# Patient Record
Sex: Female | Born: 1975 | Race: Black or African American | Hispanic: No | Marital: Married | Smoking: Never smoker
Health system: Southern US, Community
[De-identification: ages and names within clinical notes are randomized; demographics above are authoritative.]

## PROBLEM LIST (undated history)

## (undated) DIAGNOSIS — E119 Type 2 diabetes mellitus without complications: Secondary | ICD-10-CM

## (undated) DIAGNOSIS — M6282 Rhabdomyolysis: Secondary | ICD-10-CM

---

## 2001-05-17 ENCOUNTER — Other Ambulatory Visit: Admission: RE | Admit: 2001-05-17 | Discharge: 2001-05-17 | Payer: Self-pay | Admitting: Obstetrics and Gynecology

## 2001-05-17 ENCOUNTER — Other Ambulatory Visit: Admission: RE | Admit: 2001-05-17 | Discharge: 2001-05-17 | Payer: Self-pay | Admitting: *Deleted

## 2001-11-25 ENCOUNTER — Inpatient Hospital Stay (HOSPITAL_COMMUNITY): Admission: AD | Admit: 2001-11-25 | Discharge: 2001-11-28 | Payer: Self-pay | Admitting: Obstetrics and Gynecology

## 2002-04-26 ENCOUNTER — Other Ambulatory Visit: Admission: RE | Admit: 2002-04-26 | Discharge: 2002-04-26 | Payer: Self-pay | Admitting: Obstetrics and Gynecology

## 2004-02-04 ENCOUNTER — Other Ambulatory Visit: Admission: RE | Admit: 2004-02-04 | Discharge: 2004-02-04 | Payer: Self-pay | Admitting: Obstetrics and Gynecology

## 2004-07-31 ENCOUNTER — Inpatient Hospital Stay (HOSPITAL_COMMUNITY): Admission: EM | Admit: 2004-07-31 | Discharge: 2004-08-05 | Payer: Self-pay | Admitting: *Deleted

## 2005-05-17 ENCOUNTER — Other Ambulatory Visit: Admission: RE | Admit: 2005-05-17 | Discharge: 2005-05-17 | Payer: Self-pay | Admitting: Obstetrics and Gynecology

## 2005-11-24 ENCOUNTER — Encounter: Admission: RE | Admit: 2005-11-24 | Discharge: 2005-11-24 | Payer: Self-pay | Admitting: Obstetrics and Gynecology

## 2006-01-04 ENCOUNTER — Inpatient Hospital Stay (HOSPITAL_COMMUNITY): Admission: AD | Admit: 2006-01-04 | Discharge: 2006-01-07 | Payer: Self-pay | Admitting: Obstetrics and Gynecology

## 2006-01-04 ENCOUNTER — Encounter (INDEPENDENT_AMBULATORY_CARE_PROVIDER_SITE_OTHER): Payer: Self-pay | Admitting: Specialist

## 2009-11-19 ENCOUNTER — Other Ambulatory Visit: Admission: RE | Admit: 2009-11-19 | Discharge: 2009-11-19 | Payer: Self-pay | Admitting: Family Medicine

## 2010-03-30 ENCOUNTER — Other Ambulatory Visit: Payer: Self-pay | Admitting: Family Medicine

## 2010-03-30 ENCOUNTER — Ambulatory Visit
Admission: RE | Admit: 2010-03-30 | Discharge: 2010-03-30 | Disposition: A | Discharge status: Home / Self Care | Payer: 59 | Source: Ambulatory Visit | Attending: Family Medicine | Admitting: Family Medicine

## 2010-03-30 DIAGNOSIS — R1011 Right upper quadrant pain: Secondary | ICD-10-CM

## 2010-03-30 MED ORDER — IOHEXOL 300 MG/ML  SOLN
100.0000 mL | Freq: Once | INTRAMUSCULAR | Status: AC | PRN
  Administered 2010-03-30: 100 mL via INTRAVENOUS

## 2010-04-05 ENCOUNTER — Other Ambulatory Visit: Payer: Self-pay

## 2010-04-09 ENCOUNTER — Other Ambulatory Visit: Payer: Self-pay | Admitting: General Surgery

## 2010-04-15 ENCOUNTER — Ambulatory Visit
Admission: RE | Admit: 2010-04-15 | Discharge: 2010-04-15 | Disposition: A | Discharge status: Home / Self Care | Payer: 59 | Source: Ambulatory Visit | Attending: General Surgery | Admitting: General Surgery

## 2010-07-09 NOTE — H&P (Signed)
NAME:  Tracy, Hoover                        ACCOUNT NO.:  [id number]   MEDICAL RECORD NO.:  [id number]                   PATIENT TYPE:  MAT   LOCATION:  MATC                                 FACILITY:  [facility]   PHYSICIAN:  Crist Fat. Rivard, M.D.              DATE OF BIRTH:  28-Jan-1976   DATE OF ADMISSION:  11/25/2001  DATE OF DISCHARGE:                                HISTORY & PHYSICAL   HISTORY OF PRESENT ILLNESS:  The patient is a 35 year old gravida 1, para 0,  at 37-3/7 weeks who presents with spontaneous rupture of membranes at  approximately 5:30 p.m. this afternoon with clear fluid noted and minimal  uterine contractions.  She reports positive fetal movement and denies  vaginal bleeding, headache, blurry vision, or epigastric pain.  Pregnancy  has been remarkable for:  1:  Positive group B Strep; 2.  History of  seizures in the past, but no seizures in 6-7 years and no current  medications; 3.  History of depression.   PRENATAL LABORATORY:  Blood type is O positive, Rh antibody negative, VDRL  nonreactive, rubella titer positive, hepatitis B surface antigen negative,  HIV nonreactive, sickle cell test negative.  GC and Chlamydia cultures were  normal.  Pap was normal.  Glucose challenge was normal.  AFP was normal.  Hemoglobin upon entering the practice was 11.2.  It was 9.9 at 25-6/7 weeks.  EDC of December 13, 2001 was established by last menstrual period and was in  agreement with ultrasound at approximately 18 weeks.  Group B Strep culture  was positive at 36 weeks.   HISTORY OF PRESENT PREGNANCY:  The patient entered care at approximately 10  weeks.  She had a yeast infection noted on her Pap smear and treated that  when she began to have some symptoms.  She had an ultrasound done at 17  weeks that showed normal growth and fluid in anatomy.  Four chamber heart  was seen on ultrasound at 26 weeks.  She had minimal weight gain during this  pregnancy, but had adequate  fundal height growth.  She did have carpal  tunnel during this pregnancy and was using wrist splints.  The rest of her  pregnancy was essentially uncomplicated.   OBSTETRICAL HISTORY:  The patient is primigravida.   PAST MEDICAL HISTORY:  1. The patient was on Nordette until December 2001.  2. In 1996 she had Chlamydia and was treated.  3. She has a history of occasional yeast.  4. She reports the usual childhood illnesses.  5. She has a history of anemia.  6. 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  She has not been on any medications and not had     any seizures for that period of time.  7. In 1997 she was diagnosed with depression and took Prozac  for a short     period of time.   ALLERGIES:  She has no known medications allergies.   FAMILY HISTORY:  Several family members have hypertension.  Her mother and  father both have hypertension.  Her mother has anemia, maternal grandfather  and paternal grandfather have emphysema.  Her mother, brother, father, and  maternal grandmother, and aunts and uncles all have diabetes.  Her maternal  grandmother had dialysis secondary to diabetes.  Both sets of grandparents  and her mother have some type of arthritis.  Great uncle and second cousins  have some type of cancer.   Mother had seizures from Alzheimer's.   GENETIC HISTORY:  Remarkable for the father of the baby having possible  sickle cell trait.   SOCIAL HISTORY:  The patient is single.  The father of the baby is involved  and supportive.  His name is Chantil Bari.  The patient is [place] and of the [place] faith.  She has four years of college.  She is  employed with early [employer].  Her partner is high school educated.  He is  employed at [employer].  She has been followed by the certified nurse  midwife service of [facility].  She denies any alcohol, drug, or tobacco  use during this pregnancy.    PHYSICAL EXAMINATION:  VITAL SIGNS:  Vital signs are stable.  The patient is  afebrile.  HEENT:  Within normal limits.  LUNGS:  Breath sounds are clear.  HEART:  Regular rate and rhythm without murmur.  BREASTS:  Soft and nontender.  ABDOMEN:  Fundal height is approximately 37 cm.  Estimated fetal weight 6-7  pounds.  Uterine contractions are every six minutes, very mild.  The patient  is unaware of them.  The patient is leaking copious, clear fluid.  Electronic fetal monitor reveals reactive fetal heart rate tracing with no  decelerations.  PELVIC:  Cervix stretch fingertip, posterior thick, and presenting part was  out of the pelvis.  Vertex was verified by bedside ultrasound.  EXTREMITIES:  Deep tendon reflexes are 2+ without clonus.  There is a trace  of edema noted.   IMPRESSION:  1. Intrauterine pregnancy at 37-3/7 weeks.  2. Spontaneous rupture of membranes, minimal labor.  3. Positive group B Strep.   PLAN:  1. Admit to birthing suite for consult with Dr. Silverio Lay as attending     physician.  2. Group B Strep prophylaxis of penicillin G per standard dosing.  3. Will augment labor p.r.n. if no increase in labor in 4-6 hours.        Renaldo Reel Emilee Hero, C.N.M.                   Crist Fat Rivard, M.D.    Leeanne Mannan  D:  11/25/2001  T:  11/25/2001  Job:  [id number]

## 2010-07-09 NOTE — H&P (Signed)
[name], [name]              ACCOUNT NO.:  [id number]   MEDICAL RECORD NO.:  [id number]          PATIENT TYPE:  INP   LOCATION:  0101                         FACILITY:  [facility]   PHYSICIAN:  Melissa L. Ladona Ridgel, MD  DATE OF BIRTH:  1975-04-24   DATE OF ADMISSION:  07/31/2004  DATE OF DISCHARGE:                                HISTORY & PHYSICAL   CHIEF COMPLAINT:  Pain all over my body and blood in my urine.   PRIMARY CARE PHYSICIAN:  She has none; she usually sees the Urgent Care in  [place].   HISTORY OF PRESENT ILLNESS:  The patient is a 35 year old African-American  female who developed aching of her body and thought that she had the flu  approximately one week ago, the Thursday before this admission.  She states  that she took Tylenol Flu and stated that the symptoms improved.  The Monday  after the onset, she states that the symptoms returned and she proceeded to  notice that her urine was quite dark and in fact, she thought it was bloody,  and therefore she went to the [place] [facility].  The patient went to  the Urgent Care and received antibiotics for what was felt to be a urinary  tract infection.  She started those and when her body aches continued to  escalate including increase in leg pain especially in her calves, she went  back to the Urgent Care.  They did some laboratory work and noted that her  potassium was high and sent her to the emergency room.   REVIEW OF SYSTEMS:  She denies any fevers, chills, nausea, vomiting or  diarrhea.  She has not been eating very well or drinking because she states  that is painful to get out of bed.  She denies any headaches and no visual  changes, no shortness of breath or chest pain.  She does have diffuse muscle  pain, specifically in her calves.  All other review of systems were  negative.   PAST MEDICAL HISTORY:  She had a history of grand mal seizures that were  supposedly stress induced.  She was treated with  medications but then that  was discontinued.  Her last seizure was in 1998.  Her last menstrual period  was Jul 10, 2004.  She recently visited the Urgent Care center for headaches  which were felt to be stress related.   PAST SURGICAL HISTORY:  She denies any.   SOCIAL HISTORY:  She does not smoke.  She has occasional alcohol.  She works  as a Child psychotherapist.  She denies any illicit drugs.   FAMILY HISTORY:  Mom and dad are both living with diabetes.  She has a  brother who has diabetes, who was diagnosed in his early 79s, and mother  mentioned that he may have had a similar event but does not know when.   ALLERGIES:  No known drug allergies.   MEDICATIONS:  None.   VITAL SIGNS: Temperature 97, blood pressure 120/71, pulse 128 and regular,  respiratory 20, saturation 98%.  Generally, she is in no acute  distress.  She is normocephalic, atraumatic.  Pupils equal, round, and reactive to  light.  Extraocular movements are intact.  Mucous membranes are moist.  NECK:  Supple.  There is no JVD, no lymph nodes, and no carotid bruits.  CHEST:  Clear to auscultation with decreased breath sounds, right greater  than left base.  No rhonchi, rales or wheezes.  CARDIOVASCULAR:  Tachycardic.  Positive S1 and S2.  No S3 or S4.  No  murmurs, rubs or gallops.  ABDOMEN:  Soft, nontender, nondistended with positive bowel sounds.  EXTREMITIES:  No clubbing, cyanosis or edema.  She does have pain all over  in all muscle groups but in particular, she is complaining of bilateral calf  pain.  NEUROLOGIC:  She is awake, alert and oriented 3.  Power is 4/5, symmetrical  but limited by pain.  DTRs are 2+.  She has down going plantars.   Her laboratories reveal sodium 135, potassium 5.4, chloride 100, CO2 25, BUN  14, creatinine 0.9, glucose 92.  White count 9.0, hemoglobin 14.7,  hematocrit 43.3, platelets 348.  Urine pregnancy is negative.  Her U/A shows  a pH of 5.5, greater than 80 ketones, leukocyte  esterase small, nitrates  negative, WBC count 0.2.  AST 1484, ALT 443. CK 381,025.  EKG is pending but  her rhythm strip shows normal sinus rhythm at the rate of 128-130, clear P  waves, no ST/T wave changes.   ASSESSMENT/PLAN:  This is a 35 year old African-American female with a  recent complaint of upper respiratory infection-type symptoms, mild sore  throat, muscle aches that progressed, worsening fatigue, muscle pain.  She  was found to have increased potassium and tachycardia with severe  rhabdomyolysis today in the emergency room.  The patient had interim  treatment for a urinary tract infection with Levaquin but then progressed on  to the current symptomatology.  At this time the differential diagnosis  includes virally induced rhabdomyolysis.   Problem 1. Cardiovascular:  She is tachycardic likely secondary to  dehydration and pain.  Myocarditis is less likely since she does not  complain of any chest pain and has no rub.  We will aggressively rehydrate,  check a baseline EKG, serial cardiac enzymes in order to evaluate and treat  what appears to be rhabdomyolysis.   Problem 2. Pulmonary:  She has no clinical history for any pneumonia and no  clinical findings consistent with that other than decreased breath sounds at  the right base.  We will check a chest x-ray to rule out occult pneumonia.   Problem 3. GI, Anorexia:  Will encourage a soft diet and start with clears  and advance as tolerated.   Problem 4.  GU: Right now, she is receiving appropriately a bicarb drip  which will aide in the alkalinization of her urine.  We will follow up a  potassium level.  Will consider adding Lasix if the patient does not diurese  well.  A Foley catheter will be added to allow for her strict Is and Os.   Problem 5. Endocrine:  She has no history of diabetes, but has a brother  diagnosed in his early 2s.  We will follow her blood sugars while she is on the D5, following her potassium  levels, and treat appropriately if they  become more elevated and will check a TSH to rule out hypothyroidism.   Problem 6. ID:  This likely represents a community acquired viral illness.  The patient has no recent travel  and at this time does not appear to have  risk factors for HIV although primary HIV presentation could present this  way.  Will address further inquiries if appropriate regarding her HIV  status.  DVT prophylaxis will be with Lovenox and will check ultrasounds of  the lower extremities to rule out DVT since she has been lying around for at  least a week.      MLT/MEDQ  D:  07/31/2004  T:  07/31/2004  Job:  [id number]

## 2010-07-09 NOTE — Discharge Summary (Signed)
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 Approximately July 24, 2004 she began to  experience generalized aching and flu-like symptoms. She took Tylenol Flu  with some improvement in her symptoms but on June 5 the aching became more  severe. She also noticed that her urine had a dark appearance. She went to  [facility] and a urinalysis was done. She was advised that  she probably had a urinary tract infection and was given an antibiotic. She  did not recall exactly which antibiotic she received. The muscle aches  became increasingly severe, particularly in the calf area. She returned to  [facility] and laboratory studies were obtained. Concern was  expressed about her potassium level and she was referred to [facility]  emergency room. In the emergency room she was seen by Dr. Derenda Mis.   PHYSICAL EXAMINATION:  VITAL SIGNS: At the time of her evaluation showed a  temperature 97, blood pressure 120/71, pulse was 120, respirations 20, O2  saturation 98%.  HEENT EXAM: Within normal limits.  CHEST:  Clear.  CARDIOVASCULAR:  Revealed a regular rate and rhythm. There was normal S1-S2,  the patient was tachycardiac. There are no rubs, murmurs or gallops.  ABDOMEN:  The abdomen was benign.  NEUROLOGIC TESTING:  Was within normal limits.  EXTREMITIES: Examination of extremities revealed no clubbing, cyanosis or  edema. The patient complained subjectively of diffuse muscle pain but  muscles were not especially tender to palpation.   HOSPITAL COURSE:   Relevant initial laboratory studies obtained  which  included a CBC which showed a white count of 9000, hemoglobin of 14.7. CMP  which was notable for a potassium of 5.4, creatinine of 0.9 and abnormal  SGOT of 1484. A CPK was obtained which was 381,025 Urine. pregnancy test was  negative. Urinalysis was interpreted as showing large amounts of blood and  protein. A TSH was within normal limits. A chest x-ray was normal.  On  admission the patient was placed on an IV of D5 with 2 ampules of  bicarbonate which the patient received for total of 1 liter. She was then  given D5 and half-normal saline with 2 ampules of bicarbonate 200 cc/hour. A  Foley catheter was placed. Morphine was administered for pain. The patient  was placed on subcutaneous Lovenox. Venous Doppler's of the lower  extremities were performed which were normal.  CPK was carefully monitored  and came down in progressive increments. On June 11th it was 191,000, on  June 12  it was 146,000, on June 13 it was 94,000, on June 14 it was 52,000,  and on June15 it  was  30,000.   LABORATORY DATA:  Blood pressure remained stable; renal function was also  carefully monitored and  remained normal. For example, on June15 the  creatinine was 0.7. On June 13 the Foley catheter was discontinued. On  June14 the bicarbonate was removed from the IV and the patient was just  given IV of normal saline. By EAVW09 her muscle aching had entirely  resolved. The patient was feeling well. She had a good appetite. She was  afebrile. She was having no nausea or vomiting. She was taking p.o. and  fluids without difficulty. Urine was normal in appearance. It was therefore  felt reasonable to discharge the patient at that time.   DISCHARGE DIAGNOSIS:  Rhabdomyolysis probably secondary to viral illness.   DISCHARGE MEDICATIONS:  None.   DISCHARGE INSTRUCTIONS:  The patient was advised at the time of discharge to  drink lots of fluids to maintain a clear  urine. She was also advised to  follow-up with a physician on the following Monday to repeat her CPK and  BMET values. Previously she has been going to [facility] but  she expressed a desire to be seen at one of the [place] locations so I  suggested that she follow-up with the [place] physicians at [facility].  I  provided her with their telephone number and suggested that she see Dr.  Lynelle Doctor or Dr. Delrae Alfred. The patient was advised that if her severe muscle  cramps should return that we would be happy to see her in the emergency room  at any time although I think this is quite unlikely.   Condition at time of discharge was good.       SY/MEDQ  D:  08/05/2004  T:  08/05/2004  Job:  [id number]   cc:   [name]   [facility]  [place]

## 2010-07-09 NOTE — H&P (Signed)
NAMESALEEN, PEDEN             ACCOUNT NO.:  [id number]   MEDICAL RECORD NO.:  [id number]          PATIENT TYPE:  INP   LOCATION:  9168                          FACILITY:  [facility]   PHYSICIAN:  Naima A. Dillard, M.D. DATE OF BIRTH:  Mar 23, 1975   DATE OF ADMISSION:  01/04/2006  DATE OF DISCHARGE:                                HISTORY & PHYSICAL   Ms. Mott is a 35 year old gravida 2, para 1-0-1-1, at 31 weeks who  presented with spontaneous rupture of membranes at approximately 7:30 p.m.  with clear fluid noted.  Contractions currently are every 2-3 minutes.  The  pregnancy has been remarkable for:  1. Gestational diabetes with no insulin required.  2. Questionable dates.  3. Seizure disorder with patient off medications since 1993.  4. History of rhabdomyolysis.  5. History of depression.  6. Increased Down's syndrome risk on quadruple screening but amniocentesis      declined.   Prenatal labs reveal blood type O positive, Rh antibody negative, VDRL  nonreactive, Rubella titer positive, hepatitis B surface antigen negative,  sickle cell test negative.  cystic fibrosis testing was negative, GC and  Chlamydia cultures were negative.  Pap was normal in March.  Hemoglobin upon  entering the practice was 11.2.  It was 9.7 at 28 weeks.  Quadruple  screening showed an elevated Down's syndrome risk of 1 in 269.  Amniocentesis was declined.  She had an elevated one hour Glucola at 197.  Three hour GTT was also abnormal with a diagnosis of gestational diabetes.  Group B Strep culture was negative at 36 weeks.  EDC of January 17, 2006,  was established by seven week ultrasound secondary to questionable LMP.   HISTORY OF PRESENT PREGNANCY:  The patient entered care at approximately 10  weeks.  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  She had a  quadruple screen done at 8 weeks that showed an elevated Down's syndrome  risk of 1 in 269.  Amniocentesis was offered but she  declined this.  She had  an ultrasound for anatomy at 18 weeks with no significant findings.  HIV was  also done at 18 weeks which was negative.  She had another ultrasound at 25  weeks showing normal growth and development, growth in the 73rd percentile,  fluid was normal.  She had another ultrasound at 28 weeks, again, with  normal findings.  Growth was at the 89th percentile and fluid was at the  55th percentile.  The patient's hemoglobin at 28 weeks was 9.7.  She was  placed on iron.  She had an elevated one hour GGT of 197 and had a three  hour GGT that had all abnormal values except for her fasting.  She had  another ultrasound at 32 weeks showing growth at the 95th percentile.  She  had twice weekly NSTs begun at 32 weeks.  She had another ultrasound at 36  weeks with normal growth.  She was scheduled for induction on November 26.   OBSTETRICAL HISTORY:  In 2003, she had a vaginal birth of a female infant,  weight 6  pounds 13 ounces at 38 weeks.  She was in labor 12 hours.  She had  epidural anesthesia.  She did have some depression, another episode of  depression in the past, this did resolve.   MEDICAL HISTORY:  She was on oral contraceptives until February 2007.  She  reports the usual childhood illnesses.  She has a history of anemia as a  teenager.  She did have grand mal seizures in 1988.  She was on medications  at that time but those were discontinued in 1993.  She was diagnosed with  rhabdomyolysis in 2006.  She was hospitalized 4-5 days for this.  She was  treated for depression in 1997 but has not been on any medication since  then.   ALLERGIES:  The patient has no known medication allergies.   FAMILY HISTORY:  Her father has heart disease and had a bypass surgery.  Her  maternal uncle had an MI and is now deceased.  Her mother, father, and  maternal grandmother have hypertension.  A maternal aunt had an aneurysm.  The patient's mother had anemia.  Paternal  grandfather and maternal  grandfather had emphysema.  Her mother, father, brother, maternal  grandmother, and maternal uncles have insulin dependent diabetes.  Paternal  grandmother had Alzheimer's.  A paternal aunt had some unknown type of  cancer.   The patient has only had hospitalization for childbirth and for the  rhabdomyolysis diagnosis in 2006.   GENETIC HISTORY:  Remarkable for the patient's nephew being mentally  retarded.   SOCIAL HISTORY:  The patient is married to the father of the baby.  He is  involved and supportive, his name is Lynnell Dike.  The patient is  Tree surgeon.  She is of the [place] faith.  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  Her husband has one year of college and is employed in  production.  She was originally followed by the certified nurse midwife but  then was transferred to physician care with her diagnosis of gestational  diabetes.   REVIEW OF SYSTEMS:  The patient reports the previously noted leaking of  clear fluid and uterine contractions every 2-3 minutes.  No other  significant findings are noted.   PHYSICAL EXAMINATION:  VITAL SIGNS:  Stable, the patient is afebrile.  HEENT:  Within normal limits.  LUNGS:  Breath sounds are clear.  HEART:  Regular rate and rhythm without murmur.  BREASTS:  Soft, nontender.  ABDOMEN:  Fundal height is approximately 39 cm.  Estimated fetal weight is 7-  8 pounds.  Uterine contractions are every 2-3 minutes, 60 seconds in  duration, moderate quality.  Fetal heart rate is in the 150s by Doppler in  triage.  The patient is noted to be leaking clear fluid.  Cervix is 1-2 cm,  50% vertex, at a -2 station per RN exam in triage.  EXTREMITIES:  Deep tendon reflexes are 2+ without clonus, there is trace  edema noted.   ASSESSMENT:  1. Intrauterine pregnancy at 38 weeks.  2. Spontaneous rupture of membranes in early labor.  3. Negative group B Strep.  4. Gestational diabetes requiring no  insulin.   PLAN: 1. Admit to birthing suite per consult with Dr. Normand Sloop as the attending      physician.  2. Routine physician orders.  3. Per Dr. Redmond Baseman recommendation, will check CBGs q.4h.  4. Patient plans epidural as labor progresses.  5. Physicians will follow.  Renaldo Reel Emilee Hero, C.N.M.      Naima A. Normand Sloop, M.D.  Electronically Signed    VLL/MEDQ  D:  01/04/2006  T:  01/04/2006  Job:  [id number]

## 2013-08-26 ENCOUNTER — Other Ambulatory Visit: Payer: Self-pay | Admitting: Family Medicine

## 2013-08-26 ENCOUNTER — Other Ambulatory Visit (HOSPITAL_COMMUNITY)
Admission: RE | Admit: 2013-08-26 | Discharge: 2013-08-26 | Disposition: A | Discharge status: Home / Self Care | Payer: 59 | Source: Ambulatory Visit | Attending: Family Medicine | Admitting: Family Medicine

## 2013-08-26 DIAGNOSIS — Z0000 Encounter for general adult medical examination without abnormal findings: Secondary | ICD-10-CM | POA: Insufficient documentation

## 2013-08-26 DIAGNOSIS — Z1231 Encounter for screening mammogram for malignant neoplasm of breast: Secondary | ICD-10-CM

## 2013-08-28 LAB — CYTOLOGY - PAP

## 2013-11-19 ENCOUNTER — Ambulatory Visit
Admission: RE | Admit: 2013-11-19 | Discharge: 2013-11-19 | Disposition: A | Discharge status: Home / Self Care | Payer: 59 | Source: Ambulatory Visit | Attending: Family Medicine | Admitting: Family Medicine

## 2013-11-19 DIAGNOSIS — Z1231 Encounter for screening mammogram for malignant neoplasm of breast: Secondary | ICD-10-CM

## 2014-11-26 ENCOUNTER — Other Ambulatory Visit: Payer: Self-pay

## 2014-11-26 DIAGNOSIS — Z1231 Encounter for screening mammogram for malignant neoplasm of breast: Secondary | ICD-10-CM

## 2014-12-12 ENCOUNTER — Ambulatory Visit
Admission: RE | Admit: 2014-12-12 | Discharge: 2014-12-12 | Disposition: A | Discharge status: Home / Self Care | Payer: 59 | Source: Ambulatory Visit

## 2014-12-12 DIAGNOSIS — Z1231 Encounter for screening mammogram for malignant neoplasm of breast: Secondary | ICD-10-CM

## 2015-05-05 ENCOUNTER — Encounter (HOSPITAL_COMMUNITY): Payer: Self-pay

## 2015-05-05 DIAGNOSIS — E559 Vitamin D deficiency, unspecified: Secondary | ICD-10-CM | POA: Diagnosis present

## 2015-05-05 DIAGNOSIS — M791 Myalgia: Secondary | ICD-10-CM | POA: Diagnosis not present

## 2015-05-05 DIAGNOSIS — Z7984 Long term (current) use of oral hypoglycemic drugs: Secondary | ICD-10-CM

## 2015-05-05 DIAGNOSIS — E119 Type 2 diabetes mellitus without complications: Secondary | ICD-10-CM | POA: Diagnosis present

## 2015-05-05 DIAGNOSIS — Z833 Family history of diabetes mellitus: Secondary | ICD-10-CM

## 2015-05-05 DIAGNOSIS — M6282 Rhabdomyolysis: Principal | ICD-10-CM | POA: Diagnosis present

## 2015-05-05 DIAGNOSIS — Z841 Family history of disorders of kidney and ureter: Secondary | ICD-10-CM

## 2015-05-05 DIAGNOSIS — J069 Acute upper respiratory infection, unspecified: Secondary | ICD-10-CM | POA: Diagnosis present

## 2015-05-05 DIAGNOSIS — I10 Essential (primary) hypertension: Secondary | ICD-10-CM | POA: Diagnosis present

## 2015-05-05 DIAGNOSIS — R740 Nonspecific elevation of levels of transaminase and lactic acid dehydrogenase [LDH]: Secondary | ICD-10-CM | POA: Diagnosis present

## 2015-05-05 DIAGNOSIS — Z8249 Family history of ischemic heart disease and other diseases of the circulatory system: Secondary | ICD-10-CM

## 2015-05-05 LAB — URINE MICROSCOPIC-ADD ON

## 2015-05-05 LAB — COMPREHENSIVE METABOLIC PANEL
ALBUMIN: 3.4 g/dL — AB (ref 3.5–5.0)
ALT: 107 U/L — ABNORMAL HIGH (ref 14–54)
ANION GAP: 13 (ref 5–15)
AST: 630 U/L — ABNORMAL HIGH (ref 15–41)
Alkaline Phosphatase: 60 U/L (ref 38–126)
BILIRUBIN TOTAL: 0.5 mg/dL (ref 0.3–1.2)
BUN: 11 mg/dL (ref 6–20)
CO2: 21 mmol/L — AB (ref 22–32)
Calcium: 8.8 mg/dL — ABNORMAL LOW (ref 8.9–10.3)
Chloride: 101 mmol/L (ref 101–111)
Creatinine, Ser: 0.78 mg/dL (ref 0.44–1.00)
GFR calc non Af Amer: >60 mL/min (ref >60)
GLUCOSE: 120 mg/dL — AB (ref 65–99)
POTASSIUM: 4 mmol/L (ref 3.5–5.1)
SODIUM: 135 mmol/L (ref 135–145)
TOTAL PROTEIN: 6.8 g/dL (ref 6.5–8.1)

## 2015-05-05 LAB — CBC
HEMATOCRIT: 37.5 % (ref 36.0–46.0)
HEMOGLOBIN: 12.4 g/dL (ref 12.0–15.0)
MCH: 27 pg (ref 26.0–34.0)
MCHC: 33.1 g/dL (ref 30.0–36.0)
MCV: 81.5 fL (ref 78.0–100.0)
Platelets: 244 10*3/uL (ref 150–400)
RBC: 4.6 MIL/uL (ref 3.87–5.11)
RDW: 12.2 % (ref 11.5–15.5)
WBC: 8.9 10*3/uL (ref 4.0–10.5)

## 2015-05-05 LAB — URINALYSIS, ROUTINE W REFLEX MICROSCOPIC
BILIRUBIN URINE: NEGATIVE
Glucose, UA: NEGATIVE mg/dL
Ketones, ur: NEGATIVE mg/dL
NITRITE: NEGATIVE
PH: 6 (ref 5.0–8.0)
Protein, ur: 100 mg/dL — AB
SPECIFIC GRAVITY, URINE: 1.007 (ref 1.005–1.030)

## 2015-05-05 LAB — LIPASE, BLOOD: Lipase: 28 U/L (ref 11–51)

## 2015-05-05 NOTE — ED Notes (Signed)
Pt here with c/o muscle plain to arms, legs and abdomen. She also reports her urine has been dark. Hx of Rhabdomyolysis in 2006.

## 2015-05-06 ENCOUNTER — Inpatient Hospital Stay (HOSPITAL_COMMUNITY): Payer: 59

## 2015-05-06 ENCOUNTER — Inpatient Hospital Stay (HOSPITAL_COMMUNITY)
Admission: EM | Admit: 2015-05-06 | Discharge: 2015-05-11 | DRG: 558 | Disposition: A | Discharge status: Home / Self Care | Payer: 59 | Attending: Internal Medicine | Admitting: Internal Medicine

## 2015-05-06 ENCOUNTER — Encounter (HOSPITAL_COMMUNITY): Payer: Self-pay | Admitting: Family Medicine

## 2015-05-06 DIAGNOSIS — Z7984 Long term (current) use of oral hypoglycemic drugs: Secondary | ICD-10-CM | POA: Diagnosis not present

## 2015-05-06 DIAGNOSIS — R7401 Elevation of levels of liver transaminase levels: Secondary | ICD-10-CM | POA: Insufficient documentation

## 2015-05-06 DIAGNOSIS — E0800 Diabetes mellitus due to underlying condition with hyperosmolarity without nonketotic hyperglycemic-hyperosmolar coma (NKHHC): Secondary | ICD-10-CM | POA: Diagnosis not present

## 2015-05-06 DIAGNOSIS — R059 Cough, unspecified: Secondary | ICD-10-CM | POA: Insufficient documentation

## 2015-05-06 DIAGNOSIS — E559 Vitamin D deficiency, unspecified: Secondary | ICD-10-CM | POA: Diagnosis present

## 2015-05-06 DIAGNOSIS — R05 Cough: Secondary | ICD-10-CM | POA: Diagnosis not present

## 2015-05-06 DIAGNOSIS — M6282 Rhabdomyolysis: Secondary | ICD-10-CM | POA: Insufficient documentation

## 2015-05-06 DIAGNOSIS — J069 Acute upper respiratory infection, unspecified: Secondary | ICD-10-CM | POA: Diagnosis present

## 2015-05-06 DIAGNOSIS — I10 Essential (primary) hypertension: Secondary | ICD-10-CM | POA: Insufficient documentation

## 2015-05-06 DIAGNOSIS — T796XXA Traumatic ischemia of muscle, initial encounter: Secondary | ICD-10-CM | POA: Diagnosis not present

## 2015-05-06 DIAGNOSIS — Z833 Family history of diabetes mellitus: Secondary | ICD-10-CM | POA: Diagnosis not present

## 2015-05-06 DIAGNOSIS — R740 Nonspecific elevation of levels of transaminase and lactic acid dehydrogenase [LDH]: Secondary | ICD-10-CM | POA: Diagnosis present

## 2015-05-06 DIAGNOSIS — Z8249 Family history of ischemic heart disease and other diseases of the circulatory system: Secondary | ICD-10-CM | POA: Diagnosis not present

## 2015-05-06 DIAGNOSIS — Z841 Family history of disorders of kidney and ureter: Secondary | ICD-10-CM | POA: Diagnosis not present

## 2015-05-06 DIAGNOSIS — E119 Type 2 diabetes mellitus without complications: Secondary | ICD-10-CM | POA: Diagnosis present

## 2015-05-06 DIAGNOSIS — M791 Myalgia: Secondary | ICD-10-CM | POA: Diagnosis present

## 2015-05-06 HISTORY — DX: Rhabdomyolysis: M62.82

## 2015-05-06 HISTORY — DX: Type 2 diabetes mellitus without complications: E11.9

## 2015-05-06 LAB — CK: Total CK: >50000 U/L — ABNORMAL HIGH (ref 38–234)

## 2015-05-06 LAB — GLUCOSE, CAPILLARY
GLUCOSE-CAPILLARY: 157 mg/dL — AB (ref 65–99)
Glucose-Capillary: 136 mg/dL — ABNORMAL HIGH (ref 65–99)

## 2015-05-06 LAB — PREGNANCY, URINE: Preg Test, Ur: NEGATIVE

## 2015-05-06 LAB — CBG MONITORING, ED: GLUCOSE-CAPILLARY: 91 mg/dL (ref 65–99)

## 2015-05-06 MED ORDER — NORGESTIM-ETH ESTRAD TRIPHASIC 0.18/0.215/0.25 MG-35 MCG PO TABS
1.0000 | ORAL_TABLET | Freq: Every day | ORAL | Status: DC
  Administered 2015-05-07 – 2015-05-10 (×4): 1 via ORAL

## 2015-05-06 MED ORDER — SODIUM CHLORIDE 0.9 % IV BOLUS (SEPSIS)
2000.0000 mL | Freq: Once | INTRAVENOUS | Status: AC
  Administered 2015-05-06: 2000 mL via INTRAVENOUS

## 2015-05-06 MED ORDER — SODIUM CHLORIDE 0.9 % IV BOLUS (SEPSIS)
1000.0000 mL | Freq: Once | INTRAVENOUS | Status: AC
  Administered 2015-05-06: 1000 mL via INTRAVENOUS

## 2015-05-06 MED ORDER — ENOXAPARIN SODIUM 30 MG/0.3ML ~~LOC~~ SOLN: 30.0000 mg | SUBCUTANEOUS | Status: DC

## 2015-05-06 MED ORDER — ENOXAPARIN SODIUM 40 MG/0.4ML ~~LOC~~ SOLN
40.0000 mg | SUBCUTANEOUS | Status: DC
  Administered 2015-05-06 – 2015-05-11 (×6): 40 mg via SUBCUTANEOUS
  Filled 2015-05-06 (×6): qty 0.4

## 2015-05-06 MED ORDER — HYDRALAZINE HCL 20 MG/ML IJ SOLN: 5.0000 mg | INTRAMUSCULAR | Status: DC | PRN

## 2015-05-06 MED ORDER — ONDANSETRON HCL 4 MG/2ML IJ SOLN: 4.0000 mg | Freq: Four times a day (QID) | INTRAMUSCULAR | Status: DC | PRN

## 2015-05-06 MED ORDER — SODIUM BICARBONATE 8.4 % IV SOLN
100.0000 meq | Freq: Once | INTRAVENOUS | Status: AC
  Administered 2015-05-06: 100 meq via INTRAVENOUS

## 2015-05-06 MED ORDER — SODIUM CHLORIDE 0.9 % IV SOLN
INTRAVENOUS | Status: DC
  Administered 2015-05-06: 125 mL/h via INTRAVENOUS
  Administered 2015-05-06 – 2015-05-07 (×3): via INTRAVENOUS

## 2015-05-06 MED ORDER — VITAMIN D (ERGOCALCIFEROL) 1.25 MG (50000 UNIT) PO CAPS
50000.0000 [IU] | ORAL_CAPSULE | ORAL | Status: DC
  Administered 2015-05-07 – 2015-05-11 (×2): 50000 [IU] via ORAL
  Filled 2015-05-06 (×5): qty 1

## 2015-05-06 MED ORDER — INSULIN ASPART 100 UNIT/ML ~~LOC~~ SOLN
0.0000 [IU] | Freq: Three times a day (TID) | SUBCUTANEOUS | Status: DC
  Administered 2015-05-06: 2 [IU] via SUBCUTANEOUS
  Administered 2015-05-07 (×2): 1 [IU] via SUBCUTANEOUS
  Administered 2015-05-08: 5 [IU] via SUBCUTANEOUS
  Administered 2015-05-08: 3 [IU] via SUBCUTANEOUS
  Administered 2015-05-09: 7 [IU] via SUBCUTANEOUS
  Administered 2015-05-09 (×2): 5 [IU] via SUBCUTANEOUS
  Administered 2015-05-10: 7 [IU] via SUBCUTANEOUS
  Administered 2015-05-10: 5 [IU] via SUBCUTANEOUS
  Administered 2015-05-10 – 2015-05-11 (×3): 2 [IU] via SUBCUTANEOUS

## 2015-05-06 MED ORDER — SODIUM BICARBONATE 8.4 % IV SOLN
INTRAVENOUS | Status: AC
Start: 2015-05-06 — End: 2015-05-06
  Filled 2015-05-06: qty 50

## 2015-05-06 MED ORDER — ONDANSETRON HCL 4 MG PO TABS: 4.0000 mg | ORAL_TABLET | Freq: Four times a day (QID) | ORAL | Status: DC | PRN

## 2015-05-06 NOTE — ED Provider Notes (Signed)
CSN: [id number]     Arrival date & time 05/05/15  2206 History   First MD Initiated Contact with Patient 05/06/15 0138     Chief Complaint  Patient presents with  . Muscle Pain     (Consider location/radiation/quality/duration/timing/severity/associated sxs/prior Treatment) The history is provided by the patient and medical records. No language interpreter was used.     Cana Mignano is a 40 y.o. female  with a hx of NIDDM presents to the Emergency Department complaining of gradual, persistent, progressively worsening muscle pain onset yesterday with development of dark urine this morning.  Pt reports viral URI symptoms last week with associated ST and cough, but no fevers, nausea or vomiting.  She did not Receive a flu vaccination this year.  She denies sick contacts.  Patient reports a history of rhabdomyolysis in 2006 of unknown etiology. She denies new activities, heavy lifting or periods of Immobilization. She denies numbness, weakness or paresthesias in her legs.  She denies difficulty walking. No known diarrhea or alleviating factors. She denies fever, chills, neck pain, chest pain, shortness of breath, nausea, vomiting, diarrhea weakness, dizziness, syncope.  Does endorse some mild epigastric cramping beginning this evening.  PCP: Nunzio Cory  Past Medical History  Diagnosis Date  . Diabetes mellitus without complication (HCC)    No past surgical history on file. No family history on file. Social History  Substance Use Topics  . Smoking status: Never Smoker   . Smokeless tobacco: None  . Alcohol Use: Yes   OB History    No data available     Review of Systems  Constitutional: Negative for fever, diaphoresis, appetite change, fatigue and unexpected weight change.  HENT: Negative for mouth sores.   Eyes: Negative for visual disturbance.  Respiratory: Negative for cough, chest tightness, shortness of breath and wheezing.   Cardiovascular: Negative for chest pain.   Gastrointestinal: Negative for nausea, vomiting, abdominal pain, diarrhea and constipation.  Endocrine: Negative for polydipsia, polyphagia and polyuria.  Genitourinary: Negative for dysuria, urgency, frequency and hematuria.       Dark urine  Musculoskeletal: Positive for myalgias. Negative for back pain and neck stiffness.  Skin: Negative for rash.  Allergic/Immunologic: Negative for immunocompromised state.  Neurological: Negative for syncope, light-headedness and headaches.  Hematological: Does not bruise/bleed easily.  Psychiatric/Behavioral: Negative for sleep disturbance. The patient is not nervous/anxious.       Allergies  Review of patient's allergies indicates no known allergies.  Home Medications   Prior to Admission medications   Medication Sig Start Date End Date Taking? Authorizing Provider  JANUMET XR [phone] MG TB24 Take 1 tablet by mouth daily. 04/20/15  Yes Historical Provider, MD  TRI-PREVIFEM 0.18/0.215/0.25 MG-35 MCG tablet Take 1 tablet by mouth daily. 04/19/15  Yes Historical Provider, MD  Vitamin D, Ergocalciferol, (DRISDOL) 50000 units CAPS capsule Take 1 capsule by mouth 2 (two) times a week. 04/15/15  Yes Historical Provider, MD   BP 121/74 mmHg  Pulse 94  Temp(Src) 98.3 F (36.8 C) (Oral)  Resp 19  Ht  (1.549 m)  Wt 94.008 kg  BMI 39.18 kg/m2  SpO2 93%  LMP 04/14/2015 Physical Exam  Constitutional: She appears well-developed and well-nourished. No distress.  Awake, alert, nontoxic appearance  HENT:  Head: Normocephalic and atraumatic.  Mouth/Throat: Oropharynx is clear and moist. No oropharyngeal exudate.  Eyes: Conjunctivae are normal. No scleral icterus.  Neck: Normal range of motion. Neck supple.  Cardiovascular: Regular rhythm and intact distal pulses.  Tachycardia  present.   Pulses:      Radial pulses are 2+ on the right side, and 2+ on the left side.  Pulmonary/Chest: Effort normal and breath sounds normal. No respiratory distress.  She has no wheezes.  Equal chest expansion  Abdominal: Soft. Bowel sounds are normal. She exhibits no mass. There is generalized tenderness. There is no rebound and no guarding.  Pt reports mild tenderness to palpation.  Abd is soft without rebound or guarding.    Musculoskeletal: Normal range of motion. She exhibits no edema.  Neurological: She is alert.  Speech is clear and goal oriented Moves extremities without ataxia  Skin: Skin is warm and dry. She is not diaphoretic.  Psychiatric: She has a normal mood and affect.  Nursing note and vitals reviewed.   ED Course  Procedures (including critical care time) Labs Review Labs Reviewed  COMPREHENSIVE METABOLIC PANEL - Abnormal; Notable for the following:    CO2 21 (*)    Glucose, Bld 120 (*)    Calcium 8.8 (*)    Albumin 3.4 (*)    AST 630 (*)    ALT 107 (*)    All other components within normal limits  URINALYSIS, ROUTINE W REFLEX MICROSCOPIC (NOT AT [facility]) - Abnormal; Notable for the following:    Color, Urine AMBER (*)    APPearance CLOUDY (*)    Hgb urine dipstick LARGE (*)    Protein, ur 100 (*)    Leukocytes, UA TRACE (*)    All other components within normal limits  URINE MICROSCOPIC-ADD ON - Abnormal; Notable for the following:    Squamous Epithelial / LPF 6-30 (*)    Bacteria, UA FEW (*)    Casts GRANULAR CAST (*)    All other components within normal limits  LIPASE, BLOOD  CBC  CK  I-STAT BETA HCG BLOOD, ED ([facility], [facility], [facility] ONLY)    MDM   Final diagnoses:  Non-traumatic rhabdomyolysis   Georgeann OppenheimShetina Steward presents with myalgias and dark urine. History of rhabdomyolysis. Suspect the same. Urinalysis with large hemoglobin but no evidence of infection. Elevated AST and ALT. CK pending. Patient is tachycardic. Will give fluids and plan for admission.  No CP or SOB.     6:36 AM CK continues to pend in the lab for dilution.  Pt has been given 2L fluid and bicarb, 2 amps IV push.  Discussed with Dr. Toniann FailKakrakandy who will  admit.  He requests 2 additional liters of fluid.    Dahlia ClientHannah Jodilyn Giese, PA-C 05/06/15 [phone]  Tomasita CrumbleAdeleke Oni, MD 05/06/15 [phone]

## 2015-05-06 NOTE — ED Notes (Signed)
MD Merrel at the bedside

## 2015-05-06 NOTE — H&P (Signed)
[facility] History and Physical  Tracy OppenheimShetina Oshel VWU:[id number] DOB: 1975/08/12 DOA: 05/06/2015  Referring physician: Dr Mora Bellmanni PCP[facility]  Chief Complaint: Muscle aches, dark urine  HPI: [name] is a 40 y.o. female with pmh of DM2 and episode of idiopathic rhabdomyolysis in 2006 presents to ED with complaints of muscle aches and dark urine. According to patient, symptoms began 2 days ago. She recognized symptoms from previous episode of rhabdo. She describes symptoms of URI last week with nonproductive cough and sore throat. She took OTC Tylenol cold and sinus and symptoms have resolved. She awoke Monday am with muscle aches and dark urine. She has not exercised recently, no recent trauma. She denies recent fever, headache, dizziness, chest pain, sob, abdominal pain, n/v/d.  Upon eval in the ED, pt found to have CK of >50,000 with mild elevation of LFTs. She has received 4L NS fluid bolus and bicarbonate bolus. She denies any new medications. She is to be admitted at this time for further evaluation and treatment. Interestingly, her brother also has hx of rhabdomyolysis of unknown cause.    Past Medical History  Diagnosis Date  . Diabetes mellitus without complication (HCC)      Review of Systems:  No weight loss, night sweats, Fevers, chills, fatigue.  No headaches, Difficulty swallowing,Tooth/dental problems,Sore throat, No chest pain, Orthopnea, PND, swelling in lower extremities, anasarca, dizziness, palpitations  No heartburn, indigestion, abdominal pain, nausea, vomiting, diarrhea, change in bowel habits, loss of appetite  No shortness of breath with exertion or at rest. No excess mucus, no productive cough, No non-productive cough, No coughing up of blood.No change in color of mucus.No wheezing.No chest wall deformity  no rash or lesions.  no dysuria, no urgency or frequency. No flank pain.  No joint pain or swelling. No decreased range of  motion. No back pain.  No change in mood or affect. No depression or anxiety. No memory loss.  No change in sensation, unilateral strength, or cognitive abilities  All other systems were reviewed and are negative.   Social history: Married. 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 2 young children. Nonsmoker, occ etoh  Family hx: Mother deceased age 42: CAD/CABG, DM, ESRD Father 1972: DM, CAD/CABG, htn Brother 3746: hx rhabdo  NKDA    Prior to Admission medications   Medication Sig Start Date End Date Taking? Authorizing Provider  JANUMET XR [phone] MG TB24 Take 1 tablet by mouth daily. 04/20/15  Yes Historical Provider, MD  TRI-PREVIFEM 0.18/0.215/0.25 MG-35 MCG tablet Take 1 tablet by mouth daily. 04/19/15  Yes Historical Provider, MD  Vitamin D, Ergocalciferol, (DRISDOL) 50000 units CAPS capsule Take 1 capsule by mouth 2 (two) times a week. 04/15/15  Yes Historical Provider, MD   Physical Exam: Filed Vitals:   05/06/15 0359 05/06/15 0400 05/06/15 0500 05/06/15 0600  BP: 121/69 121/69 131/67 121/74  Pulse: 96 95 84 94  Temp:      TempSrc:      Resp: 21 20 19 19   Height:      Weight:      SpO2: 97% 97% 97% 93%    Wt Readings from Last 3 Encounters:  05/05/15 94.008 kg (207 lb 4 oz)    General: Awake, alert sitting upright in bed in NAD, very pleasant Eyes: PERRL, EOMI, normal lids ENT: grossly normal hearing, lips & tongue, MMM Neck:  no LAD, masses or thyromegaly Cardiovascular: S1S2 RRR, no m/r/g. No LE edema.  Respiratory: CTA bilaterally, no w/r/r. Normal respiratory  effort. Abdomen: soft, ntnd, bowel sounds normoactive, no rebound or guarding Skin:  no rash or induration seen on limited exam Musculoskeletal: grossly normal tone BUE/BLE Psychiatric:  grossly normal mood and affect, speech fluent and appropriate Neurologic:  CN 2-12 grossly intact, moves all extremities in coordinated fashion.          Labs on Admission:  Basic Metabolic Panel:  Recent Labs Lab  05/05/15 2215  NA 135  K 4.0  CL 101  CO2 21*  GLUCOSE 120*  BUN 11  CREATININE 0.78  CALCIUM 8.8*   Liver Function Tests:  Recent Labs Lab 05/05/15 2215  AST 630*  ALT 107*  ALKPHOS 60  BILITOT 0.5  PROT 6.8  ALBUMIN 3.4*    Recent Labs Lab 05/05/15 2215  LIPASE 28    CBC:  Recent Labs Lab 05/05/15 2215  WBC 8.9  HGB 12.4  HCT 37.5  MCV 81.5  PLT 244   Cardiac Enzymes:  Recent Labs Lab 05/06/15 0424  CKTOTAL >50000*    BNP (last 3 results) No results for input(s): BNP in the last 8760 hours.  ProBNP (last 3 results) No results for input(s): PROBNP in the last 8760 hours.   CREATININE: 0.78 (05/05/15 2215) Estimated creatinine clearance - 96.9 mL/min  CBG: No results for input(s): GLUCAP in the last 168 hours.  Radiological Exams on Admission: No results found.   Assessment/Plan Active Problems:   Rhabdomyolysis   Vitamin D deficiency   Diabetes mellitus (HCC)   1. Rhabdomyolysis -Pt with hx of same, suspect d/t recent viral illness -s/p bicarbonate infusion and 4L NS in ED -continue IVF hydration, cycle CK -am labs  2. DM2 -Will hold oral agent while inpt -SSI  3. Transaminitis, mild -secondary to rhabdo -IV hydration, f/u labs  4. Hx Vit D Def -continue home medication  Code Status: full code DVT Prophylaxis: SQ Lovenox Family Communication: none available at time of admit Disposition Plan: Pending Improvement   Cordelia Pen, NP-C [facility]  pgr [phone]

## 2015-05-06 NOTE — ED Notes (Signed)
Heart Healthy diet tray to be ordered

## 2015-05-06 NOTE — Plan of Care (Signed)
40 year old female with diabetes mellitus presents with muscle pain and dark urine. LFTs are elevated and patient's CK levels are too high to count at this time and labs are still diluting to find the exact levels. Patient was given bicarbonate bolus with fluid bolus and will be admitted for rhabdomyolysis.  Tracy MiniumArshad Luiz Hoover.

## 2015-05-06 NOTE — ED Notes (Signed)
CBG 91 

## 2015-05-06 NOTE — ED Notes (Addendum)
Pt eating at bedside.

## 2015-05-07 ENCOUNTER — Encounter (HOSPITAL_COMMUNITY): Payer: Self-pay | Admitting: General Surgery

## 2015-05-07 DIAGNOSIS — R05 Cough: Secondary | ICD-10-CM | POA: Insufficient documentation

## 2015-05-07 DIAGNOSIS — M6282 Rhabdomyolysis: Principal | ICD-10-CM

## 2015-05-07 DIAGNOSIS — E0800 Diabetes mellitus due to underlying condition with hyperosmolarity without nonketotic hyperglycemic-hyperosmolar coma (NKHHC): Secondary | ICD-10-CM

## 2015-05-07 DIAGNOSIS — R059 Cough, unspecified: Secondary | ICD-10-CM | POA: Insufficient documentation

## 2015-05-07 LAB — COMPREHENSIVE METABOLIC PANEL
ALBUMIN: 2.6 g/dL — AB (ref 3.5–5.0)
ALT: 121 U/L — ABNORMAL HIGH (ref 14–54)
ANION GAP: 9 (ref 5–15)
AST: 662 U/L — ABNORMAL HIGH (ref 15–41)
Alkaline Phosphatase: 47 U/L (ref 38–126)
BILIRUBIN TOTAL: 0.5 mg/dL (ref 0.3–1.2)
BUN: 6 mg/dL (ref 6–20)
CO2: 22 mmol/L (ref 22–32)
Calcium: 8.1 mg/dL — ABNORMAL LOW (ref 8.9–10.3)
Chloride: 105 mmol/L (ref 101–111)
Creatinine, Ser: 0.74 mg/dL (ref 0.44–1.00)
GLUCOSE: 122 mg/dL — AB (ref 65–99)
POTASSIUM: 4.5 mmol/L (ref 3.5–5.1)
Sodium: 136 mmol/L (ref 135–145)
TOTAL PROTEIN: 5.7 g/dL — AB (ref 6.5–8.1)

## 2015-05-07 LAB — CBC
HEMATOCRIT: 31.3 % — AB (ref 36.0–46.0)
Hemoglobin: 10.6 g/dL — ABNORMAL LOW (ref 12.0–15.0)
MCH: 27.7 pg (ref 26.0–34.0)
MCHC: 33.9 g/dL (ref 30.0–36.0)
MCV: 81.9 fL (ref 78.0–100.0)
Platelets: 205 10*3/uL (ref 150–400)
RBC: 3.82 MIL/uL — ABNORMAL LOW (ref 3.87–5.11)
RDW: 12.4 % (ref 11.5–15.5)
WBC: 6.1 10*3/uL (ref 4.0–10.5)

## 2015-05-07 LAB — TSH: TSH: 1.482 u[IU]/mL (ref 0.350–4.500)

## 2015-05-07 LAB — CK

## 2015-05-07 LAB — GLUCOSE, CAPILLARY
GLUCOSE-CAPILLARY: 103 mg/dL — AB (ref 65–99)
Glucose-Capillary: 125 mg/dL — ABNORMAL HIGH (ref 65–99)
Glucose-Capillary: 145 mg/dL — ABNORMAL HIGH (ref 65–99)

## 2015-05-07 LAB — SEDIMENTATION RATE: SED RATE: 56 mm/h — AB (ref 0–22)

## 2015-05-07 MED ORDER — METHYLPREDNISOLONE SODIUM SUCC 125 MG IJ SOLR
125.0000 mg | Freq: Two times a day (BID) | INTRAMUSCULAR | Status: DC
  Administered 2015-05-07 – 2015-05-08 (×2): 125 mg via INTRAVENOUS
  Filled 2015-05-07 (×2): qty 2

## 2015-05-07 MED ORDER — SODIUM BICARBONATE 8.4 % IV SOLN
INTRAVENOUS | Status: DC
  Administered 2015-05-07 – 2015-05-08 (×4): via INTRAVENOUS
  Filled 2015-05-07 (×9): qty 150

## 2015-05-07 MED ORDER — INSULIN GLARGINE 100 UNIT/ML ~~LOC~~ SOLN
20.0000 [IU] | Freq: Every day | SUBCUTANEOUS | Status: DC
  Administered 2015-05-07: 20 [IU] via SUBCUTANEOUS
  Filled 2015-05-07 (×2): qty 0.2

## 2015-05-07 NOTE — Progress Notes (Addendum)
[facility] PROGRESS NOTE  [name] ZOX:[id number] DOB: [birth year] DOA: 05/06/2015 PCP: No primary care provider on file.  Length of stay: 1   Assessment/Plan: Active Problems:   Rhabdomyolysis   Vitamin D deficiency   Diabetes mellitus (HCC)   Cough   Brief summary 40 y.o. female with pmh of DM2 and episode of idiopathic rhabdomyolysis in 2006 presents to ED with complaints of muscle aches and dark urine. According to patient, symptoms began 2 days ago. She recognized symptoms from previous episode of rhabdo. She describes symptoms of URI last week with nonproductive cough and sore throat. She took OTC Tylenol cold and sinus and symptoms have resolved. She awoke Monday am with muscle aches and dark urine. She has not exercised recently, no recent trauma. She denies recent fever, headache, dizziness, chest pain, sob, abdominal pain, n/v/d.  Upon eval in the ED, pt found to have CK of >50,000 with mild elevation of LFTs. She has received 4L NS fluid bolus and bicarbonate bolus. She denies any new medications. She is to be admitted at this time for further evaluation and treatment. Interestingly, her brother also has hx of rhabdomyolysis of unknown cause.     Assessment and plan   1. Rhabdomyolysis, CK greater than 50,000, unresponsive to IV fluids -Pt with hx of same, suspect d/t recent viral illness Change IV fluids to sodium bicarbonate infusion Patient may need muscle biopsy for further diagnosis Rule out autoimmune/viral etiologies, HIV antibody, TSH, all of these serologies are ordered and pending Patient would need muscle biopsy for definitive diagnosis and outpatient rheumatology evaluation Anticipate starting patient on IV steroids once a muscle biopsy is completed  2. DM2 -Will hold oral agent while inpt -SSI If CBG increases due to D5 bicarbonate infusion, will switch back to normal saline  3. Transaminitis, mild -secondary to rhabdo -IV hydration,  f/u labs  4. Hx Vit D Def -continue home medication  DVT prophylaxsis  Lovenox  Code Status:      Code Status Orders        Start     Ordered   05/06/15 0840  Full code   Continuous     05/06/15 0839    Code Status History    Date Active Date Inactive Code Status Order ID Comments User Context   This patient has a current code status but no historical code status.      Family Communication: Discussed in detail with the patient, all imaging results, lab results explained to the patient   Disposition Plan:  As above     Consultants:   Gen. surgery  Procedures:   None  Antibiotics: Anti-infectives    None         HPI/Subjective:  Continues to have diffuse muscle aches and pains  Objective: Filed Vitals:   05/06/15 1652 05/06/15 1700 05/06/15 2100 05/07/15 0500  BP: 143/68  140/79 140/90  Pulse: 105  107 104  Temp: 97.7 F (36.5 C)  98.5 F (36.9 C) 98.2 F (36.8 C)  TempSrc: Oral  Oral Oral  Resp: Height:   (1.549 m)    Weight:  94.6 kg (208 lb 8.9 oz)    SpO2: 100%  100% 99%    Intake/Output Summary (Last 24 hours) at 05/07/15 1208 Last data filed at 05/07/15 1055  Gross per 24 hour  Intake 2968.33 ml  Output   4800 ml  Net -1831.67 ml    Exam:  General: No acute respiratory distress Lungs: Clear to auscultation bilaterally without wheezes or crackles Cardiovascular: Regular rate and rhythm without murmur gallop or rub normal S1 and S2 Abdomen: Nontender, nondistended, soft, bowel sounds positive, no rebound, no ascites, no appreciable mass Extremities: No significant cyanosis, clubbing, or edema bilateral lower extremities     Data Review   Micro Results No results found for this or any previous visit (from the past 240 hour(s)).  Radiology Reports Dg Chest 2 View  05/06/2015  CLINICAL DATA:  Cough for 1 week EXAM: CHEST  2 VIEW COMPARISON:  March 22, 2010 FINDINGS: There is no edema or consolidation. Heart  size and pulmonary vascularity are normal. No adenopathy. No bone lesions. IMPRESSION: No edema or consolidation. Electronically Signed   By: Bretta BangWilliam  Woodruff III M.D.   On: 05/06/2015 09:04     CBC  Recent Labs Lab 05/05/15 2215 05/07/15 0609  WBC 8.9 6.1  HGB 12.4 10.6*  HCT 37.5 31.3*  PLT 244 205  MCV 81.5 81.9  MCH 27.0 27.7  MCHC 33.1 33.9  RDW 12.2 12.4    Chemistries   Recent Labs Lab 05/05/15 2215 05/07/15 0609  NA 135 136  K 4.0 4.5  CL 101 105  CO2 21* 22  GLUCOSE 120* 122*  BUN 11 6  CREATININE 0.78 0.74  CALCIUM 8.8* 8.1*  AST 630* 662*  ALT 107* 121*  ALKPHOS 60 47  BILITOT 0.5 0.5   ------------------------------------------------------------------------------------------------------------------ estimated creatinine clearance is 97.2 mL/min (by C-G formula based on Cr of 0.74). ------------------------------------------------------------------------------------------------------------------ No results for input(s): HGBA1C in the last 72 hours. ------------------------------------------------------------------------------------------------------------------ No results for input(s): CHOL, HDL, LDLCALC, TRIG, CHOLHDL, LDLDIRECT in the last 72 hours. ------------------------------------------------------------------------------------------------------------------  Recent Labs  05/07/15 0916  TSH 1.482   ------------------------------------------------------------------------------------------------------------------ No results for input(s): VITAMINB12, FOLATE, FERRITIN, TIBC, IRON, RETICCTPCT in the last 72 hours.  Coagulation profile No results for input(s): INR, PROTIME in the last 168 hours.  No results for input(s): DDIMER in the last 72 hours.  Cardiac Enzymes No results for input(s): CKMB, TROPONINI, MYOGLOBIN in the last 168 hours.  Invalid input(s):  CK ------------------------------------------------------------------------------------------------------------------ Invalid input(s): POCBNP   CBG:  Recent Labs Lab 05/06/15 1239 05/06/15 1804 05/06/15 2059 05/07/15 0754 05/07/15 1146  GLUCAP 91 157* 136* 103* 125*       Studies: Dg Chest 2 View  05/06/2015  CLINICAL DATA:  Cough for 1 week EXAM: CHEST  2 VIEW COMPARISON:  March 22, 2010 FINDINGS: There is no edema or consolidation. Heart size and pulmonary vascularity are normal. No adenopathy. No bone lesions. IMPRESSION: No edema or consolidation. Electronically Signed   By: Bretta BangWilliam  Woodruff III M.D.   On: 05/06/2015 09:04      No results found for: HGBA1C Lab Results  Component Value Date   CREATININE 0.74 05/07/2015       Scheduled Meds: . enoxaparin (LOVENOX) injection  40 mg Subcutaneous Q24H  . insulin aspart  0-9 Units Subcutaneous TID WC  . Norgestimate-Ethinyl Estradiol Triphasic  1 tablet Oral Daily  . Vitamin D (Ergocalciferol)  50,000 Units Oral Once per day on Mon Thu   Continuous Infusions: . sodium chloride 125 mL/hr at 05/07/15 1001    Active Problems:   Rhabdomyolysis   Vitamin D deficiency   Diabetes mellitus (HCC)   Cough    Time spent: 45 minutes   [facility] HiLLCrest HospitalBROL,Karrah Mangini  [facility] Pager [phone]. If 7PM-7AM, please contact night-coverage at [email or web address], password [email or web address] 05/07/2015, 12:08 PM  LOS: 1  day

## 2015-05-07 NOTE — Consult Note (Signed)
Reason for Consult: rhabdomyolysis  Referring Physician: Dr. Reyne Dumas    HPI: [name] is a 40 year old female with a history of diabetes mellitus, hypertension and remote history of rhabdomyolysis in 2007 who presented with muscle soreness.  The patient reports having URI like symptoms last week and taking tylenol cold.  On Monday she developed sore muscles characterized like she has been working out.  On Tuesday, noticed dark urine and therefore came to the ED.  She denies any new medication.  Denies new work out regimen.  Denies muscle weakness, numbness or tingling.  Denies shortness of breath or chest pains.  Reports her brother has had rhabdomyolysis twice in the past without a clear etiology and an apparent negative muscle biopsy.  Denies a family history of autoimmune disorders.   CK total was >50, 000.  This AM remains >50, 000.  LTFs are elevated.  Continues to have muscle pain.  Ambulating in hallways.   Past Medical History  Diagnosis Date  . Diabetes mellitus without complication ([place])   . Rhabdomyolysis     History reviewed. No pertinent past surgical history.  Family History  Problem Relation Age of Onset  . CAD    . Kidney failure    . Hypertension    . Diabetes    . CAD Mother   . Heart attack Mother   . Hypertension Mother   . Kidney disease Mother   . CAD Father   . Hypertension Father   . Hypertension Brother   . Diabetes Brother     Social History:  reports that she has never smoked. She does not have any smokeless tobacco history on file. She reports that she drinks alcohol. Her drug history is not on file.  Allergies: No Known Allergies  Medications:  Scheduled Meds: . enoxaparin (LOVENOX) injection  40 mg Subcutaneous Q24H  . insulin aspart  0-9 Units Subcutaneous TID WC  . Norgestimate-Ethinyl Estradiol Triphasic  1 tablet Oral Daily  . Vitamin D (Ergocalciferol)  50,000 Units Oral Once per day on Mon Thu   Continuous Infusions: . sodium  chloride 125 mL/hr at 05/07/15 1001   PRN Meds:.hydrALAZINE, ondansetron **OR** ondansetron (ZOFRAN) IV   Results for orders placed or performed during the hospital encounter of 05/06/15 (from the past 48 hour(s))  Pregnancy, urine     Status: None   Collection Time: 05/05/15 10:14 PM  Result Value Ref Range   Preg Test, Ur NEGATIVE NEGATIVE    Comment:        THE SENSITIVITY OF THIS METHODOLOGY IS >20 mIU/mL.   Lipase, blood     Status: None   Collection Time: 05/05/15 10:15 PM  Result Value Ref Range   Lipase 28 11 - 51 U/L  Comprehensive metabolic panel     Status: Abnormal   Collection Time: 05/05/15 10:15 PM  Result Value Ref Range   Sodium 135 135 - 145 mmol/L   Potassium 4.0 3.5 - 5.1 mmol/L   Chloride 101 101 - 111 mmol/L   CO2 21 (L) 22 - 32 mmol/L   Glucose, Bld 120 (H) 65 - 99 mg/dL   BUN 11 6 - 20 mg/dL   Creatinine, Ser 0.78 0.44 - 1.00 mg/dL   Calcium 8.8 (L) 8.9 - 10.3 mg/dL   Total Protein 6.8 6.5 - 8.1 g/dL   Albumin 3.4 (L) 3.5 - 5.0 g/dL   AST 630 (H) 15 - 41 U/L   ALT 107 (H) 14 - 54 U/L  Alkaline Phosphatase 60 38 - 126 U/L   Total Bilirubin 0.5 0.3 - 1.2 mg/dL   GFR calc non Af Amer >60 >60 mL/min   GFR calc Af Amer >60 >60 mL/min    Comment: (NOTE) The eGFR has been calculated using the CKD EPI equation. This calculation has not been validated in all clinical situations. eGFR's persistently <60 mL/min signify possible Chronic Kidney Disease.    Anion gap 13 5 - 15  CBC     Status: None   Collection Time: 05/05/15 10:15 PM  Result Value Ref Range   WBC 8.9 4.0 - 10.5 K/uL   RBC 4.60 3.87 - 5.11 MIL/uL   Hemoglobin 12.4 12.0 - 15.0 g/dL   HCT 37.5 36.0 - 46.0 %   MCV 81.5 78.0 - 100.0 fL   MCH 27.0 26.0 - 34.0 pg   MCHC 33.1 30.0 - 36.0 g/dL   RDW 12.2 11.5 - 15.5 %   Platelets 244 150 - 400 K/uL  Urinalysis, Routine w reflex microscopic (not at [facility])     Status: Abnormal   Collection Time: 05/05/15 10:26 PM  Result Value Ref Range    Color, Urine AMBER (A) YELLOW    Comment: BIOCHEMICALS MAY BE AFFECTED BY COLOR   APPearance CLOUDY (A) CLEAR   Specific Gravity, Urine 1.007 1.005 - 1.030   pH 6.0 5.0 - 8.0   Glucose, UA NEGATIVE NEGATIVE mg/dL   Hgb urine dipstick LARGE (A) NEGATIVE   Bilirubin Urine NEGATIVE NEGATIVE   Ketones, ur NEGATIVE NEGATIVE mg/dL   Protein, ur 100 (A) NEGATIVE mg/dL   Nitrite NEGATIVE NEGATIVE   Leukocytes, UA TRACE (A) NEGATIVE  Urine microscopic-add on     Status: Abnormal   Collection Time: 05/05/15 10:26 PM  Result Value Ref Range   Squamous Epithelial / LPF 6-30 (A) NONE SEEN   WBC, UA 0-5 0 - 5 WBC/hpf   RBC / HPF 0-5 0 - 5 RBC/hpf   Bacteria, UA FEW (A) NONE SEEN   Casts GRANULAR CAST (A) NEGATIVE  CK     Status: Abnormal   Collection Time: 05/06/15  4:24 AM  Result Value Ref Range   Total CK >50000 (H) 38 - 234 U/L    Comment: RESULTS CONFIRMED BY MANUAL DILUTION  CK     Status: Abnormal   Collection Time: 05/06/15  9:51 AM  Result Value Ref Range   Total CK >50000 (H) 38 - 234 U/L    Comment: RESULTS CONFIRMED BY MANUAL DILUTION  CBG monitoring, ED     Status: None   Collection Time: 05/06/15 12:39 PM  Result Value Ref Range   Glucose-Capillary 91 65 - 99 mg/dL  Glucose, capillary     Status: Abnormal   Collection Time: 05/06/15  6:04 PM  Result Value Ref Range   Glucose-Capillary 157 (H) 65 - 99 mg/dL  CK     Status: Abnormal   Collection Time: 05/06/15  8:40 PM  Result Value Ref Range   Total CK >50000 (H) 38 - 234 U/L    Comment: RESULTS CONFIRMED BY MANUAL DILUTION  Glucose, capillary     Status: Abnormal   Collection Time: 05/06/15  8:59 PM  Result Value Ref Range   Glucose-Capillary 136 (H) 65 - 99 mg/dL  CBC     Status: Abnormal   Collection Time: 05/07/15  6:09 AM  Result Value Ref Range   WBC 6.1 4.0 - 10.5 K/uL   RBC 3.82 (L) 3.87 - 5.11  MIL/uL   Hemoglobin 10.6 (L) 12.0 - 15.0 g/dL   HCT 31.3 (L) 36.0 - 46.0 %   MCV 81.9 78.0 - 100.0 fL   MCH 27.7  26.0 - 34.0 pg   MCHC 33.9 30.0 - 36.0 g/dL   RDW 12.4 11.5 - 15.5 %   Platelets 205 150 - 400 K/uL  Comprehensive metabolic panel     Status: Abnormal   Collection Time: 05/07/15  6:09 AM  Result Value Ref Range   Sodium 136 135 - 145 mmol/L   Potassium 4.5 3.5 - 5.1 mmol/L   Chloride 105 101 - 111 mmol/L   CO2 22 22 - 32 mmol/L   Glucose, Bld 122 (H) 65 - 99 mg/dL   BUN 6 6 - 20 mg/dL   Creatinine, Ser 0.74 0.44 - 1.00 mg/dL   Calcium 8.1 (L) 8.9 - 10.3 mg/dL   Total Protein 5.7 (L) 6.5 - 8.1 g/dL   Albumin 2.6 (L) 3.5 - 5.0 g/dL   AST 662 (H) 15 - 41 U/L   ALT 121 (H) 14 - 54 U/L   Alkaline Phosphatase 47 38 - 126 U/L   Total Bilirubin 0.5 0.3 - 1.2 mg/dL   GFR calc non Af Amer >60 >60 mL/min   GFR calc Af Amer >60 >60 mL/min    Comment: (NOTE) The eGFR has been calculated using the CKD EPI equation. This calculation has not been validated in all clinical situations. eGFR's persistently <60 mL/min signify possible Chronic Kidney Disease.    Anion gap 9 5 - 15  CK     Status: Abnormal   Collection Time: 05/07/15  9:16 AM  Result Value Ref Range   Total CK >50000 (H) 38 - 234 U/L    Comment: RESULTS CONFIRMED BY MANUAL DILUTION  Sedimentation rate     Status: Abnormal   Collection Time: 05/07/15  9:16 AM  Result Value Ref Range   Sed Rate 56 (H) 0 - 22 mm/hr  TSH     Status: None   Collection Time: 05/07/15  9:16 AM  Result Value Ref Range   TSH 1.482 0.350 - 4.500 uIU/mL    Dg Chest 2 View  05/06/2015  CLINICAL DATA:  Cough for 1 week EXAM: CHEST  2 VIEW COMPARISON:  March 22, 2010 FINDINGS: There is no edema or consolidation. Heart size and pulmonary vascularity are normal. No adenopathy. No bone lesions. IMPRESSION: No edema or consolidation. Electronically Signed   By: Lowella Grip III M.D.   On: 05/06/2015 09:04    Review of Systems  Constitutional: Negative for fever, chills, weight loss, malaise/fatigue and diaphoresis.  Eyes: Negative for blurred  vision, double vision, photophobia, pain, discharge and redness.  Respiratory: Positive for cough. Negative for hemoptysis, sputum production, shortness of breath and wheezing.   Cardiovascular: Negative for chest pain, palpitations, orthopnea, claudication, leg swelling and PND.  Gastrointestinal: Negative for heartburn, nausea, vomiting, abdominal pain, diarrhea, constipation, blood in stool and melena.  Genitourinary: Negative for dysuria, urgency, frequency, hematuria and flank pain.       Dark urine since Tuesday   Musculoskeletal: Positive for myalgias. Negative for neck pain.  Neurological: Negative for dizziness, tingling, tremors, sensory change, speech change, focal weakness, seizures, loss of consciousness and weakness.  Psychiatric/Behavioral: Negative for depression, suicidal ideas and substance abuse.   Blood pressure 140/90, pulse 104, temperature 98.2 F (36.8 C), temperature source Oral, resp. rate 16, height _0  (1.549 m), weight 94.6 kg (208 lb 8.9  oz), last menstrual period 04/14/2015, SpO2 99 %. Physical Exam  Constitutional: She is oriented to person, place, and time. She appears well-developed and well-nourished. No distress.  Cardiovascular: Normal rate, regular rhythm, normal heart sounds and intact distal pulses.  Exam reveals no gallop.   No murmur heard. Respiratory: Effort normal and breath sounds normal. No respiratory distress. She has no wheezes. She has no rales. She exhibits no tenderness.  GI: Soft. Bowel sounds are normal. She exhibits no distension and no mass. There is no tenderness. There is no rebound and no guarding.  Musculoskeletal: Normal range of motion. She exhibits no edema or tenderness.  Neurological: She is alert and oriented to person, place, and time.  Skin: Skin is warm and dry. No rash noted. She is not diaphoretic. No erythema. No pallor.  Psychiatric: She has a normal mood and affect. Her behavior is normal. Judgment and thought content  normal.    Assessment/Plan: Rhabdomyolysis Evaluation for muscle biopsy  The patient is unsure whether she wants to proceed with the procedure.   Surgical risks discussed including infection, bleeding, airway compromise, MI, DVT/PE.   She would like to think about it. Will tentatively make her NPO after midnight.  If she decides to forego the procedure, may resume diet. Will check back with her later.  VTE prophylaxis-Lovenox.  DM II-SI  Thank you for the consult.    Erby Pian ANP-BC Pager [phone] 05/07/2015, 10:53 AM

## 2015-05-08 LAB — COMPREHENSIVE METABOLIC PANEL
ALBUMIN: 2.8 g/dL — AB (ref 3.5–5.0)
ALT: 192 U/L — AB (ref 14–54)
ANION GAP: 8 (ref 5–15)
AST: 938 U/L — ABNORMAL HIGH (ref 15–41)
Alkaline Phosphatase: 55 U/L (ref 38–126)
BUN: 5 mg/dL — ABNORMAL LOW (ref 6–20)
CHLORIDE: 101 mmol/L (ref 101–111)
CO2: 26 mmol/L (ref 22–32)
CREATININE: 0.72 mg/dL (ref 0.44–1.00)
Calcium: 8.8 mg/dL — ABNORMAL LOW (ref 8.9–10.3)
GFR calc non Af Amer: >60 mL/min (ref >60)
GLUCOSE: 243 mg/dL — AB (ref 65–99)
Potassium: 4.5 mmol/L (ref 3.5–5.1)
SODIUM: 135 mmol/L (ref 135–145)
Total Bilirubin: 0.4 mg/dL (ref 0.3–1.2)
Total Protein: 6.2 g/dL — ABNORMAL LOW (ref 6.5–8.1)

## 2015-05-08 LAB — CBC
HCT: 36 % (ref 36.0–46.0)
HEMOGLOBIN: 11.8 g/dL — AB (ref 12.0–15.0)
MCH: 26.6 pg (ref 26.0–34.0)
MCHC: 32.8 g/dL (ref 30.0–36.0)
MCV: 81.3 fL (ref 78.0–100.0)
PLATELETS: 252 10*3/uL (ref 150–400)
RBC: 4.43 MIL/uL (ref 3.87–5.11)
RDW: 12.1 % (ref 11.5–15.5)
WBC: 4.6 10*3/uL (ref 4.0–10.5)

## 2015-05-08 LAB — EPSTEIN-BARR VIRUS VCA, IGG

## 2015-05-08 LAB — CK: Total CK: >50000 U/L — ABNORMAL HIGH (ref 38–234)

## 2015-05-08 LAB — HIV-1 RNA QUANT-NO REFLEX-BLD: LOG10 HIV-1 RNA: UNDETERMINED {Log_copies}/mL

## 2015-05-08 LAB — ALDOLASE: Aldolase: <1.2 U/L (ref 3.3–10.3)

## 2015-05-08 LAB — GLUCOSE, CAPILLARY
GLUCOSE-CAPILLARY: 144 mg/dL — AB (ref 65–99)
GLUCOSE-CAPILLARY: 273 mg/dL — AB (ref 65–99)
GLUCOSE-CAPILLARY: 237 mg/dL — AB (ref 65–99)
Glucose-Capillary: 231 mg/dL — ABNORMAL HIGH (ref 65–99)
Glucose-Capillary: 330 mg/dL — ABNORMAL HIGH (ref 65–99)

## 2015-05-08 LAB — CMV IGM

## 2015-05-08 LAB — CMV ANTIBODY, IGG (EIA): CMV Ab - IgG: 7.4 U/mL — ABNORMAL HIGH (ref 0.00–0.59)

## 2015-05-08 LAB — EPSTEIN-BARR VIRUS EARLY D ANTIGEN ANTIBODY, IGG: EBV Early Antigen Ab, IgG: 59.9 U/mL — ABNORMAL HIGH (ref 0.0–8.9)

## 2015-05-08 LAB — EPSTEIN-BARR VIRUS VCA, IGM: EBV VCA IgM: <36 U/mL (ref 0.0–35.9)

## 2015-05-08 MED ORDER — METHYLPREDNISOLONE SODIUM SUCC 125 MG IJ SOLR
125.0000 mg | Freq: Four times a day (QID) | INTRAMUSCULAR | Status: DC
  Administered 2015-05-08 – 2015-05-11 (×12): 125 mg via INTRAVENOUS
  Filled 2015-05-08 (×12): qty 2

## 2015-05-08 MED ORDER — INSULIN GLARGINE 100 UNIT/ML ~~LOC~~ SOLN
15.0000 [IU] | Freq: Once | SUBCUTANEOUS | Status: AC
  Administered 2015-05-08: 15 [IU] via SUBCUTANEOUS
  Filled 2015-05-08: qty 0.15

## 2015-05-08 MED ORDER — INSULIN GLARGINE 100 UNIT/ML ~~LOC~~ SOLN
30.0000 [IU] | Freq: Every day | SUBCUTANEOUS | Status: DC
  Administered 2015-05-08 – 2015-05-09 (×2): 30 [IU] via SUBCUTANEOUS
  Filled 2015-05-08 (×3): qty 0.3

## 2015-05-08 NOTE — Progress Notes (Signed)
[facility] PROGRESS NOTE  Georgeann OppenheimShetina Mellinger GNF:[id number] DOB: [birth year] DOA: 05/06/2015 PCP: No primary care provider on file.  Length of stay: 2   Assessment/Plan: Active Problems:   Rhabdomyolysis   Vitamin D deficiency   Diabetes mellitus (HCC)   Cough   Brief summary 40 y.o. female with pmh of DM2 and episode of idiopathic rhabdomyolysis in 2006 presents to ED with complaints of muscle aches and dark urine. According to patient, symptoms began 2 days ago. She recognized symptoms from previous episode of rhabdo. She describes symptoms of URI last week with nonproductive cough and sore throat. She took OTC Tylenol cold and sinus and symptoms have resolved. She awoke Monday am with muscle aches and dark urine. She has not exercised recently, no recent trauma. She denies recent fever, headache, dizziness, chest pain, sob, abdominal pain, n/v/d.  Upon eval in the ED, pt found to have CK of >50,000 with mild elevation of LFTs. She has received 4L NS fluid bolus and bicarbonate bolus. She denies any new medications. She is to be admitted at this time for further evaluation and treatment. Interestingly, her brother also has hx of rhabdomyolysis of unknown cause.     Assessment and plan   1. Rhabdomyolysis, CK greater than 50,000, unresponsive to IV fluids -Pt with hx of same, suspect d/t recent viral illness Continue IV fluids to sodium bicarbonate infusion Patient declined muscle biopsy for further diagnosis Rule out autoimmune/viral etiologies, CMV IgG positive, IgM negative, HIV antibody negative, TSH normal Patient would need muscle biopsy for definitive diagnosis in the future and outpatient rheumatology evaluation Continue IV steroids, increased dose  2. DM2 -Will hold oral agent while inpt -SSI, started on Lantus  If CBG increases due to D5 bicarbonate infusion, will switch back to normal saline  3. Transaminitis, mild -secondary to rhabdo -IV hydration, f/u  labs  4. Hx Vit D Def -continue home medication  DVT prophylaxsis  Lovenox  Code Status:      Code Status Orders        Start     Ordered   05/06/15 0840  Full code   Continuous     05/06/15 0839    Code Status History    Date Active Date Inactive Code Status Order ID Comments User Context   This patient has a current code status but no historical code status.      Family Communication: Discussed in detail with the patient, all imaging results, lab results explained to the patient   Disposition Plan:  Anticipate discharge in the next 1-2 days    Consultants:   Gen. surgery  Procedures:   None  Antibiotics: Anti-infectives    None         HPI/Subjective: Some improvement after being initiated on steroids, received the first dose of steroids late last night  Objective: Filed Vitals:   05/07/15 0500 05/07/15 1436 05/07/15 2145 05/08/15 0630  BP: 140/90 155/89 156/92 154/99  Pulse: 104 104 100 95  Temp: 98.2 F (36.8 C) 97.7 F (36.5 C) 98.5 F (36.9 C) 97.7 F (36.5 C)  TempSrc: Oral Oral    Resp: 16 18 17 18   Height:      Weight:      SpO2: 99% 100% 100% 98%    Intake/Output Summary (Last 24 hours) at 05/08/15 1325 Last data filed at 05/08/15 1100  Gross per 24 hour  Intake   1795 ml  Output   3300 ml  Net  -  1505 ml    Exam:  General: No acute respiratory distress Lungs: Clear to auscultation bilaterally without wheezes or crackles Cardiovascular: Regular rate and rhythm without murmur gallop or rub normal S1 and S2 Abdomen: Nontender, nondistended, soft, bowel sounds positive, no rebound, no ascites, no appreciable mass Extremities: No significant cyanosis, clubbing, or edema bilateral lower extremities     Data Review   Micro Results No results found for this or any previous visit (from the past 240 hour(s)).  Radiology Reports Dg Chest 2 View  05/06/2015  CLINICAL DATA:  Cough for 1 week EXAM: CHEST  2 VIEW COMPARISON:   March 22, 2010 FINDINGS: There is no edema or consolidation. Heart size and pulmonary vascularity are normal. No adenopathy. No bone lesions. IMPRESSION: No edema or consolidation. Electronically Signed   By: Bretta Bang III M.D.   On: 05/06/2015 09:04     CBC  Recent Labs Lab 05/05/15 2215 05/07/15 0609 05/08/15 0554  WBC 8.9 6.1 4.6  HGB 12.4 10.6* 11.8*  HCT 37.5 31.3* 36.0  PLT 244 205 252  MCV 81.5 81.9 81.3  MCH 27.0 27.7 26.6  MCHC 33.1 33.9 32.8  RDW 12.2 12.4 12.1    Chemistries   Recent Labs Lab 05/05/15 2215 05/07/15 0609 05/08/15 0554  NA 135 136 135  K 4.0 4.5 4.5  CL 101 105 101  CO2 21* 22 26  GLUCOSE 120* 122* 243*  BUN 11 6 5*  CREATININE 0.78 0.74 0.72  CALCIUM 8.8* 8.1* 8.8*  AST 630* 662* 938*  ALT 107* 121* 192*  ALKPHOS 60 47 55  BILITOT 0.5 0.5 0.4   ------------------------------------------------------------------------------------------------------------------ estimated creatinine clearance is 97.2 mL/min (by C-G formula based on Cr of 0.72). ------------------------------------------------------------------------------------------------------------------ No results for input(s): HGBA1C in the last 72 hours. ------------------------------------------------------------------------------------------------------------------ No results for input(s): CHOL, HDL, LDLCALC, TRIG, CHOLHDL, LDLDIRECT in the last 72 hours. ------------------------------------------------------------------------------------------------------------------  Recent Labs  05/07/15 0916  TSH 1.482   ------------------------------------------------------------------------------------------------------------------ No results for input(s): VITAMINB12, FOLATE, FERRITIN, TIBC, IRON, RETICCTPCT in the last 72 hours.  Coagulation profile No results for input(s): INR, PROTIME in the last 168 hours.  No results for input(s): DDIMER in the last 72 hours.  Cardiac  Enzymes No results for input(s): CKMB, TROPONINI, MYOGLOBIN in the last 168 hours.  Invalid input(s): CK ------------------------------------------------------------------------------------------------------------------ Invalid input(s): POCBNP   CBG:  Recent Labs Lab 05/07/15 0754 05/07/15 1146 05/07/15 1711 05/07/15 2207 05/08/15 0814  GLUCAP 103* 125* 145* 144* 231*       Studies: No results found.    No results found for: HGBA1C Lab Results  Component Value Date   CREATININE 0.72 05/08/2015       Scheduled Meds: . enoxaparin (LOVENOX) injection  40 mg Subcutaneous Q24H  . insulin aspart  0-9 Units Subcutaneous TID WC  . insulin glargine  15 Units Subcutaneous Once  . insulin glargine  30 Units Subcutaneous QHS  . methylPREDNISolone (SOLU-MEDROL) injection  125 mg Intravenous Q6H  . Norgestimate-Ethinyl Estradiol Triphasic  1 tablet Oral Daily  . Vitamin D (Ergocalciferol)  50,000 Units Oral Once per day on Mon Thu   Continuous Infusions: .  sodium bicarbonate  infusion 1000 mL 100 mL/hr at 05/08/15 1154    Active Problems:   Rhabdomyolysis   Vitamin D deficiency   Diabetes mellitus (HCC)   Cough    Time spent: 45 minutes   [facility]  [facility] Pager [phone]. If 7PM-7AM, please contact night-coverage at [email or web address], password [facility] 05/08/2015, 1:25 PM  LOS: 2 days

## 2015-05-08 NOTE — Care Management Note (Signed)
Case Management Note  Patient Details  Name: [name] MRN: [id number] Date of Birth: [birth year]  Subjective/Objective:                 Spoke with patient at the bedside. She states that she had an episode of this previously and saw a rheumatologist once but could not remember the name. She states that her PCP is [name] w/ [facility]. CM called office, they report that patient saw [name] [facility]. Names placed in AVS for apt to be closer to DC. Patient's CK is unresponsive to IVF, high dose steroids started.   Action/Plan:  No HH needs anticipated, CM will continue to follow.  Expected Discharge Date:                  Expected Discharge Plan:  Home/Self Care  In-House Referral:     Discharge planning Services  CM Consult  Post Acute Care Choice:  NA Choice offered to:     DME Arranged:    DME Agency:     HH Arranged:    HH Agency:     Status of Service:  In process, will continue to follow  Medicare Important Message Given:    Date Medicare IM Given:    Medicare IM give by:    Date Additional Medicare IM Given:    Additional Medicare Important Message give by:     If discussed at Long Length of Stay Meetings, dates discussed:    Additional Comments:  Lawerance SabalDebbie Jeannelle Wiens, RN 05/08/2015, 12:53 PM

## 2015-05-09 DIAGNOSIS — M6282 Rhabdomyolysis: Secondary | ICD-10-CM | POA: Insufficient documentation

## 2015-05-09 LAB — CBC
HCT: 33.5 % — ABNORMAL LOW (ref 36.0–46.0)
HEMOGLOBIN: 11.3 g/dL — AB (ref 12.0–15.0)
MCH: 27.5 pg (ref 26.0–34.0)
MCHC: 33.7 g/dL (ref 30.0–36.0)
MCV: 81.5 fL (ref 78.0–100.0)
PLATELETS: 270 10*3/uL (ref 150–400)
RBC: 4.11 MIL/uL (ref 3.87–5.11)
RDW: 12.3 % (ref 11.5–15.5)
WBC: 10 10*3/uL (ref 4.0–10.5)

## 2015-05-09 LAB — GLUCOSE, CAPILLARY
GLUCOSE-CAPILLARY: 269 mg/dL — AB (ref 65–99)
GLUCOSE-CAPILLARY: 334 mg/dL — AB (ref 65–99)
Glucose-Capillary: 283 mg/dL — ABNORMAL HIGH (ref 65–99)
Glucose-Capillary: 308 mg/dL — ABNORMAL HIGH (ref 65–99)

## 2015-05-09 LAB — COMPREHENSIVE METABOLIC PANEL
ALBUMIN: 2.7 g/dL — AB (ref 3.5–5.0)
ALK PHOS: 52 U/L (ref 38–126)
ALT: 229 U/L — AB (ref 14–54)
ANION GAP: 10 (ref 5–15)
AST: 659 U/L — ABNORMAL HIGH (ref 15–41)
BUN: 11 mg/dL (ref 6–20)
CALCIUM: 8.7 mg/dL — AB (ref 8.9–10.3)
CO2: 30 mmol/L (ref 22–32)
CREATININE: 0.74 mg/dL (ref 0.44–1.00)
Chloride: 96 mmol/L — ABNORMAL LOW (ref 101–111)
GFR calc Af Amer: >60 mL/min (ref >60)
GFR calc non Af Amer: >60 mL/min (ref >60)
GLUCOSE: 280 mg/dL — AB (ref 65–99)
Potassium: 4 mmol/L (ref 3.5–5.1)
Sodium: 136 mmol/L (ref 135–145)
TOTAL PROTEIN: 6 g/dL — AB (ref 6.5–8.1)

## 2015-05-09 LAB — CK

## 2015-05-09 MED ORDER — SODIUM CHLORIDE 0.9 % IV SOLN
INTRAVENOUS | Status: DC
  Administered 2015-05-09: 09:00:00 via INTRAVENOUS
  Administered 2015-05-09 – 2015-05-10 (×2): 1000 mL via INTRAVENOUS
  Administered 2015-05-10 – 2015-05-11 (×2): via INTRAVENOUS

## 2015-05-09 NOTE — Progress Notes (Signed)
[facility] PROGRESS NOTE  Krystn Dermody UJW:[id number] DOB: 06-16-1975 DOA: 05/06/2015 PCP: No primary care provider on file.  Length of stay: 3   Assessment/Plan: Active Problems:   Rhabdomyolysis   Vitamin D deficiency   Diabetes mellitus (HCC)   Cough   Non-traumatic rhabdomyolysis   Brief summary 40 y.o. female with pmh of DM2 and episode of idiopathic rhabdomyolysis in 2006 presents to ED with complaints of muscle aches and dark urine. According to patient, symptoms began 2 days ago. She recognized symptoms from previous episode of rhabdo. She describes symptoms of URI last week with nonproductive cough and sore throat. She took OTC Tylenol cold and sinus and symptoms have resolved. She awoke Monday am with muscle aches and dark urine. She has not exercised recently, no recent trauma. She denies recent fever, headache, dizziness, chest pain, sob, abdominal pain, n/v/d.  Upon eval in the ED, pt found to have CK of >50,000 with mild elevation of LFTs. She has received 4L NS fluid bolus and bicarbonate bolus. She denies any new medications. She is to be admitted at this time for further evaluation and treatment. Interestingly, her brother also has hx of rhabdomyolysis of unknown cause.     Assessment and plan   1. Rhabdomyolysis, CK Continues to be greater than 50,000, unresponsive to IV fluids -Pt with hx of same, suspect d/t recent viral illness Continue IV fluids  Switch to normal saline Patient declined muscle biopsy for further diagnosis Rule out autoimmune/viral etiologies, CMV IgG positive, IgM negative, HIV antibody negative, TSH normal Patient would need muscle biopsy for definitive diagnosis in the future and outpatient rheumatology evaluation Continue IV steroids, 125 IV every 6 Continue inpatient anticipate Rheumatology consultation on Monday CK is not improving   2. DM2 Continue Lantus and sliding scale insulin   3. Transaminitis, mild -secondary  to rhabdo -IV hydration, f/u labs  4. Hx Vit D Def -continue home medication  DVT prophylaxsis  Lovenox  Code Status:      Code Status Orders        Start     Ordered   05/06/15 0840  Full code   Continuous     05/06/15 0839    Code Status History    Date Active Date Inactive Code Status Order ID Comments User Context   This patient has a current code status but no historical code status.      Family Communication: Discussed in detail with the patient, all imaging results, lab results explained to the patient   Disposition Plan:  Anticipate discharge Monday after being assessed by rheumatology    Consultants:   Gen. surgery  Procedures:   None  Antibiotics: Anti-infectives    None         HPI/Subjective: Patient denies any myalgias, no chest pain or shortness of breath, urine continues to be dark  Objective: Filed Vitals:   05/08/15 0630 05/08/15 1434 05/08/15 2117 05/09/15 0641  BP: 154/99 137/78 149/86 146/77  Pulse: 95 93 93 77  Temp: 97.7 F (36.5 C) 98.2 F (36.8 C) 98.7 F (37.1 C) 97.7 F (36.5 C)  TempSrc:  Oral Oral Oral  Resp: Height:      Weight:      SpO2: 98% 99% 97% 99%    Intake/Output Summary (Last 24 hours) at 05/09/15 1051 Last data filed at 05/09/15 0500  Gross per 24 hour  Intake    600 ml  Output  2200 ml  Net  -1600 ml    Exam:  General: No acute respiratory distress Lungs: Clear to auscultation bilaterally without wheezes or crackles Cardiovascular: Regular rate and rhythm without murmur gallop or rub normal S1 and S2 Abdomen: Nontender, nondistended, soft, bowel sounds positive, no rebound, no ascites, no appreciable mass Extremities: No significant cyanosis, clubbing, or edema bilateral lower extremities     Data Review   Micro Results No results found for this or any previous visit (from the past 240 hour(s)).  Radiology Reports Dg Chest 2 View  05/06/2015  CLINICAL DATA:  Cough for 1  week EXAM: CHEST  2 VIEW COMPARISON:  March 22, 2010 FINDINGS: There is no edema or consolidation. Heart size and pulmonary vascularity are normal. No adenopathy. No bone lesions. IMPRESSION: No edema or consolidation. Electronically Signed   By: Bretta BangWilliam  Woodruff III M.D.   On: 05/06/2015 09:04     CBC  Recent Labs Lab 05/05/15 2215 05/07/15 0609 05/08/15 0554 05/09/15 0718  WBC 8.9 6.1 4.6 10.0  HGB 12.4 10.6* 11.8* 11.3*  HCT 37.5 31.3* 36.0 33.5*  PLT 244 205 252 270  MCV 81.5 81.9 81.3 81.5  MCH 27.0 27.7 26.6 27.5  MCHC 33.1 33.9 32.8 33.7  RDW 12.2 12.4 12.1 12.3    Chemistries   Recent Labs Lab 05/05/15 2215 05/07/15 0609 05/08/15 0554 05/09/15 0718  NA 135 136 135 136  K 4.0 4.5 4.5 4.0  CL 101 105 101 96*  CO2 21* 22 26 30   GLUCOSE 120* 122* 243* 280*  BUN 11 6 5* 11  CREATININE 0.78 0.74 0.72 0.74  CALCIUM 8.8* 8.1* 8.8* 8.7*  AST 630* 662* 938* 659*  ALT 107* 121* 192* 229*  ALKPHOS 60 47 55 52  BILITOT 0.5 0.5 0.4 <0.1*   ------------------------------------------------------------------------------------------------------------------ estimated creatinine clearance is 97.2 mL/min (by C-G formula based on Cr of 0.74). ------------------------------------------------------------------------------------------------------------------ No results for input(s): HGBA1C in the last 72 hours. ------------------------------------------------------------------------------------------------------------------ No results for input(s): CHOL, HDL, LDLCALC, TRIG, CHOLHDL, LDLDIRECT in the last 72 hours. ------------------------------------------------------------------------------------------------------------------  Recent Labs  05/07/15 0916  TSH 1.482   ------------------------------------------------------------------------------------------------------------------ No results for input(s): VITAMINB12, FOLATE, FERRITIN, TIBC, IRON, RETICCTPCT in the last 72  hours.  Coagulation profile No results for input(s): INR, PROTIME in the last 168 hours.  No results for input(s): DDIMER in the last 72 hours.  Cardiac Enzymes No results for input(s): CKMB, TROPONINI, MYOGLOBIN in the last 168 hours.  Invalid input(s): CK ------------------------------------------------------------------------------------------------------------------ Invalid input(s): POCBNP   CBG:  Recent Labs Lab 05/08/15 0814 05/08/15 1241 05/08/15 1730 05/08/15 2053 05/09/15 0814  GLUCAP 231* 273* 237* 330* 269*       Studies: No results found.    No results found for: HGBA1C Lab Results  Component Value Date   CREATININE 0.74 05/09/2015       Scheduled Meds: . enoxaparin (LOVENOX) injection  40 mg Subcutaneous Q24H  . insulin aspart  0-9 Units Subcutaneous TID WC  . insulin glargine  30 Units Subcutaneous QHS  . methylPREDNISolone (SOLU-MEDROL) injection  125 mg Intravenous Q6H  . Norgestimate-Ethinyl Estradiol Triphasic  1 tablet Oral Daily  . Vitamin D (Ergocalciferol)  50,000 Units Oral Once per day on Mon Thu   Continuous Infusions: . sodium chloride 75 mL/hr at 05/09/15 0919    Active Problems:   Rhabdomyolysis   Vitamin D deficiency   Diabetes mellitus (HCC)   Cough   Non-traumatic rhabdomyolysis    Time spent: 45 minutes  [facility]  [facility] Pager [phone]. If 7PM-7AM, please contact night-coverage at [email or web address], password [facility] 05/09/2015, 10:51 AM  LOS: 3 days

## 2015-05-10 DIAGNOSIS — E559 Vitamin D deficiency, unspecified: Secondary | ICD-10-CM

## 2015-05-10 LAB — GLUCOSE, CAPILLARY
GLUCOSE-CAPILLARY: 200 mg/dL — AB (ref 65–99)
GLUCOSE-CAPILLARY: 323 mg/dL — AB (ref 65–99)
Glucose-Capillary: 280 mg/dL — ABNORMAL HIGH (ref 65–99)
Glucose-Capillary: 321 mg/dL — ABNORMAL HIGH (ref 65–99)

## 2015-05-10 LAB — CK
CK TOTAL: 20729 U/L — AB (ref 38–234)
Total CK: 25613 U/L — ABNORMAL HIGH (ref 38–234)

## 2015-05-10 LAB — RSV(RESPIRATORY SYNCYTIAL VIRUS) AB, BLOOD: RSV Ab: 1:8 {titer} — ABNORMAL HIGH

## 2015-05-10 MED ORDER — INSULIN GLARGINE 100 UNIT/ML ~~LOC~~ SOLN
35.0000 [IU] | Freq: Every day | SUBCUTANEOUS | Status: DC
  Administered 2015-05-10: 35 [IU] via SUBCUTANEOUS
  Filled 2015-05-10 (×2): qty 0.35

## 2015-05-10 MED ORDER — INSULIN ASPART 100 UNIT/ML ~~LOC~~ SOLN
5.0000 [IU] | Freq: Once | SUBCUTANEOUS | Status: AC
  Administered 2015-05-10: 5 [IU] via SUBCUTANEOUS

## 2015-05-10 NOTE — Progress Notes (Signed)
[facility] PROGRESS NOTE  Georgeann OppenheimShetina Bolle ZOX:[id number] DOB: June 06, 1975 DOA: 05/06/2015 PCP: No primary care provider on file.  Length of stay: 4   Assessment/Plan: Active Problems:   Rhabdomyolysis   Vitamin D deficiency   Diabetes mellitus (HCC)   Cough   Non-traumatic rhabdomyolysis   Brief summary 40 y.o. female with pmh of DM2 and episode of idiopathic rhabdomyolysis in 2006 presents to ED with complaints of muscle aches and dark urine. According to patient, symptoms began 2 days ago. She recognized symptoms from previous episode of rhabdo. She describes symptoms of URI last week with nonproductive cough and sore throat. She took OTC Tylenol cold and sinus and symptoms have resolved. She awoke Monday am with muscle aches and dark urine. She has not exercised recently, no recent trauma. She denies recent fever, headache, dizziness, chest pain, sob, abdominal pain, n/v/d.  Upon eval in the ED, pt found to have CK of >50,000 with mild elevation of LFTs. She has received 4L NS fluid bolus and bicarbonate bolus. She denies any new medications. She is to be admitted at this time for further evaluation and treatment. Interestingly, her brother also has hx of rhabdomyolysis of unknown cause.     Assessment and plan   1. Rhabdomyolysis, CK Continues to be greater than 50,000, unresponsive to IV fluids Recent viral URI with symptoms starting last week Continue IV hydration Patient declined muscle biopsy for further diagnosis Rule out autoimmune/viral etiologies, CMV IgG positive, IgM negative, HIV antibody negative, TSH normal Patient would need muscle biopsy for definitive diagnosis in the future and outpatient rheumatology evaluation Will order complete autoimmune panel.  Sjogren's, Myositis Continue IV steroids, 125 IV every 6 Continue inpatient ,anticipate Rheumatology consultation on Monday If CK is not improving   2. DM2 Continue Lantus and sliding scale  insulin Lantus increased to 35 units at bedtime  3. Transaminitis, mild -secondary to rhabdo -IV hydration, f/u labs Checkacute hepatitis panel  4. Hx Vit D Def Check vitamin D levels  DVT prophylaxsis  Lovenox  Code Status:      Code Status Orders        Start     Ordered   05/06/15 0840  Full code   Continuous     05/06/15 0839    Code Status History    Date Active Date Inactive Code Status Order ID Comments User Context   This patient has a current code status but no historical code status.      Family Communication: Discussed in detail with the patient, all imaging results, lab results explained to the patient   Disposition Plan:  Anticipate discharge Monday after being assessed by rheumatology    Consultants:   Gen. surgery  Procedures:   None  Antibiotics: Anti-infectives    None         HPI/Subjective: Patient denies any myalgias, no chest pain or shortness of breath, urine continues to be dark  Objective: Filed Vitals:   05/09/15 0641 05/09/15 1400 05/09/15 2222 05/10/15 0515  BP: 146/77 154/74 152/92 142/79  Pulse: 77 97 85 73  Temp: 97.7 F (36.5 C) 97.4 F (36.3 C) 98.4 F (36.9 C) 98.2 F (36.8 C)  TempSrc: Oral  Oral Oral  Resp: 20  16 18   Height:      Weight:      SpO2: 99% 95% 98% 99%    Intake/Output Summary (Last 24 hours) at 05/10/15 1023 Last data filed at 05/10/15 [id number]  Gross  per 24 hour  Intake 2372.08 ml  Output   1975 ml  Net 397.08 ml    Exam:  General: No acute respiratory distress Lungs: Clear to auscultation bilaterally without wheezes or crackles Cardiovascular: Regular rate and rhythm without murmur gallop or rub normal S1 and S2 Abdomen: Nontender, nondistended, soft, bowel sounds positive, no rebound, no ascites, no appreciable mass Extremities: No significant cyanosis, clubbing, or edema bilateral lower extremities     Data Review   Micro Results No results found for this or any previous  visit (from the past 240 hour(s)).  Radiology Reports Dg Chest 2 View  05/06/2015  CLINICAL DATA:  Cough for 1 week EXAM: CHEST  2 VIEW COMPARISON:  March 22, 2010 FINDINGS: There is no edema or consolidation. Heart size and pulmonary vascularity are normal. No adenopathy. No bone lesions. IMPRESSION: No edema or consolidation. Electronically Signed   By: Bretta Bang III M.D.   On: 05/06/2015 09:04     CBC  Recent Labs Lab 05/05/15 2215 05/07/15 0609 05/08/15 0554 05/09/15 0718  WBC 8.9 6.1 4.6 10.0  HGB 12.4 10.6* 11.8* 11.3*  HCT 37.5 31.3* 36.0 33.5*  PLT 244 205 252 270  MCV 81.5 81.9 81.3 81.5  MCH 27.0 27.7 26.6 27.5  MCHC 33.1 33.9 32.8 33.7  RDW 12.2 12.4 12.1 12.3    Chemistries   Recent Labs Lab 05/05/15 2215 05/07/15 0609 05/08/15 0554 05/09/15 0718  NA 135 136 135 136  K 4.0 4.5 4.5 4.0  CL 101 105 101 96*  CO2 21* GLUCOSE 120* 122* 243* 280*  BUN 11 6 5* 11  CREATININE 0.78 0.74 0.72 0.74  CALCIUM 8.8* 8.1* 8.8* 8.7*  AST 630* 662* 938* 659*  ALT 107* 121* 192* 229*  ALKPHOS 60 47 55 52  BILITOT 0.5 0.5 0.4 <0.1*   ------------------------------------------------------------------------------------------------------------------ estimated creatinine clearance is 97.2 mL/min (by C-G formula based on Cr of 0.74). ------------------------------------------------------------------------------------------------------------------ No results for input(s): HGBA1C in the last 72 hours. ------------------------------------------------------------------------------------------------------------------ No results for input(s): CHOL, HDL, LDLCALC, TRIG, CHOLHDL, LDLDIRECT in the last 72 hours. ------------------------------------------------------------------------------------------------------------------ No results for input(s): TSH, T4TOTAL, T3FREE, THYROIDAB in the last 72 hours.  Invalid input(s):  FREET3 ------------------------------------------------------------------------------------------------------------------ No results for input(s): VITAMINB12, FOLATE, FERRITIN, TIBC, IRON, RETICCTPCT in the last 72 hours.  Coagulation profile No results for input(s): INR, PROTIME in the last 168 hours.  No results for input(s): DDIMER in the last 72 hours.  Cardiac Enzymes No results for input(s): CKMB, TROPONINI, MYOGLOBIN in the last 168 hours.  Invalid input(s): CK ------------------------------------------------------------------------------------------------------------------ Invalid input(s): POCBNP   CBG:  Recent Labs Lab 05/09/15 0814 05/09/15 1212 05/09/15 1651 05/09/15 2331 05/10/15 0737  GLUCAP 269* 283* 308* 334* 200*       Studies: No results found.    No results found for: HGBA1C Lab Results  Component Value Date   CREATININE 0.74 05/09/2015       Scheduled Meds: . enoxaparin (LOVENOX) injection  40 mg Subcutaneous Q24H  . insulin aspart  0-9 Units Subcutaneous TID WC  . insulin glargine  35 Units Subcutaneous QHS  . methylPREDNISolone (SOLU-MEDROL) injection  125 mg Intravenous Q6H  . Norgestimate-Ethinyl Estradiol Triphasic  1 tablet Oral Daily  . Vitamin D (Ergocalciferol)  50,000 Units Oral Once per day on Mon Thu   Continuous Infusions: . sodium chloride 100 mL/hr at 05/10/15 1610    Active Problems:   Rhabdomyolysis   Vitamin D deficiency   Diabetes mellitus (HCC)  Cough   Non-traumatic rhabdomyolysis    Time spent: 45 minutes   [facility]  [facility] Pager [phone]. If 7PM-7AM, please contact night-coverage at [email or web address], password [facility] 05/10/2015, 10:23 AM  LOS: 4 days

## 2015-05-11 DIAGNOSIS — T796XXA Traumatic ischemia of muscle, initial encounter: Secondary | ICD-10-CM

## 2015-05-11 LAB — COMPREHENSIVE METABOLIC PANEL
ALBUMIN: 2.7 g/dL — AB (ref 3.5–5.0)
ALT: 190 U/L — ABNORMAL HIGH (ref 14–54)
ANION GAP: 8 (ref 5–15)
AST: 203 U/L — ABNORMAL HIGH (ref 15–41)
Alkaline Phosphatase: 43 U/L (ref 38–126)
BILIRUBIN TOTAL: 0.3 mg/dL (ref 0.3–1.2)
BUN: 19 mg/dL (ref 6–20)
CO2: 26 mmol/L (ref 22–32)
Calcium: 8.6 mg/dL — ABNORMAL LOW (ref 8.9–10.3)
Chloride: 105 mmol/L (ref 101–111)
Creatinine, Ser: 0.79 mg/dL (ref 0.44–1.00)
GFR calc Af Amer: >60 mL/min (ref >60)
GFR calc non Af Amer: >60 mL/min (ref >60)
GLUCOSE: 182 mg/dL — AB (ref 65–99)
POTASSIUM: 4.3 mmol/L (ref 3.5–5.1)
Sodium: 139 mmol/L (ref 135–145)
TOTAL PROTEIN: 5.5 g/dL — AB (ref 6.5–8.1)

## 2015-05-11 LAB — GLUCOSE, CAPILLARY
GLUCOSE-CAPILLARY: 177 mg/dL — AB (ref 65–99)
GLUCOSE-CAPILLARY: 205 mg/dL — AB (ref 65–99)
GLUCOSE-CAPILLARY: 254 mg/dL — AB (ref 65–99)

## 2015-05-11 LAB — CBC
HCT: 35.3 % — ABNORMAL LOW (ref 36.0–46.0)
HEMOGLOBIN: 12 g/dL (ref 12.0–15.0)
MCH: 27.7 pg (ref 26.0–34.0)
MCHC: 34 g/dL (ref 30.0–36.0)
MCV: 81.5 fL (ref 78.0–100.0)
PLATELETS: 302 10*3/uL (ref 150–400)
RBC: 4.33 MIL/uL (ref 3.87–5.11)
RDW: 12.3 % (ref 11.5–15.5)
WBC: 11.8 10*3/uL — AB (ref 4.0–10.5)

## 2015-05-11 LAB — SJOGRENS SYNDROME-B EXTRACTABLE NUCLEAR ANTIBODY

## 2015-05-11 LAB — ANTI-JO 1 ANTIBODY, IGG

## 2015-05-11 LAB — ANTI-DNA ANTIBODY, DOUBLE-STRANDED

## 2015-05-11 LAB — HEPATITIS PANEL, ACUTE
Hep A IgM: NEGATIVE
Hep B C IgM: NEGATIVE
Hepatitis B Surface Ag: NEGATIVE

## 2015-05-11 LAB — CK: Total CK: 14564 U/L — ABNORMAL HIGH (ref 38–234)

## 2015-05-11 LAB — SJOGRENS SYNDROME-A EXTRACTABLE NUCLEAR ANTIBODY: SSA (Ro) (ENA) Antibody, IgG: <0.2 AI (ref 0.0–0.9)

## 2015-05-11 LAB — STRIATED MUSCLE ANTIBODY: Anti-striation Abs: NEGATIVE

## 2015-05-11 LAB — HEMOGLOBIN A1C
Hgb A1c MFr Bld: 7.3 % — ABNORMAL HIGH (ref 4.8–5.6)
Mean Plasma Glucose: 163 mg/dL

## 2015-05-11 LAB — ANTI-SMITH ANTIBODY: ENA SM Ab Ser-aCnc: <0.2 AI (ref 0.0–0.9)

## 2015-05-11 MED ORDER — INSULIN GLARGINE 100 UNIT/ML SOLOSTAR PEN: 20.0000 [IU] | PEN_INJECTOR | Freq: Every day | SUBCUTANEOUS | Status: AC

## 2015-05-11 MED ORDER — INSULIN STARTER KIT- PEN NEEDLES (ENGLISH): 1.0000 | Freq: Once | Status: AC

## 2015-05-11 MED ORDER — INSULIN STARTER KIT- PEN NEEDLES (ENGLISH)
1.0000 | Freq: Once | Status: DC
  Filled 2015-05-11: qty 1

## 2015-05-11 MED ORDER — PREDNISONE 20 MG PO TABS: 40.0000 mg | ORAL_TABLET | Freq: Two times a day (BID) | ORAL | Status: AC

## 2015-05-11 MED ORDER — PANTOPRAZOLE SODIUM 40 MG PO TBEC: 40.0000 mg | DELAYED_RELEASE_TABLET | Freq: Every day | ORAL | Status: AC

## 2015-05-11 MED ORDER — INSULIN GLARGINE 100 UNIT/ML ~~LOC~~ SOLN: 20.0000 [IU] | Freq: Every day | SUBCUTANEOUS | Status: DC

## 2015-05-11 MED ORDER — INSULIN PEN NEEDLE 30G X 5 MM MISC: Status: AC

## 2015-05-11 NOTE — Discharge Summary (Signed)
Physician Discharge Summary  [name] MRN: [id number] DOB/AGE: 1975-07-10 40 y.o.  PCP: No primary care provider on file.   Admit date: 05/06/2015 Discharge date: 05/11/2015  Discharge Diagnoses:     Active Problems:   Rhabdomyolysis   Vitamin D deficiency   Diabetes mellitus ([place])   Cough   Non-traumatic rhabdomyolysis    Follow-up recommendations Follow-up with PCP in 3-5 days , including all  additional recommended appointments as below Follow-up CBC, CMP in 3-5 days PCP/rheumatology requested to follow-up autoimmune antibody panel tests which are pending     Medication List    TAKE these medications        Insulin Glargine 100 UNIT/ML Solostar Pen  Commonly known as:  LANTUS  Inject 20 Units into the skin daily at 10 pm.     JANUMET XR [phone] MG Tb24  Generic drug:  SitaGLIPtin-MetFORMIN HCl  Take 1 tablet by mouth daily.     pantoprazole 40 MG tablet  Commonly known as:  PROTONIX  Take 1 tablet (40 mg total) by mouth daily.     predniSONE 20 MG tablet  Commonly known as:  DELTASONE  Take 2 tablets (40 mg total) by mouth 2 (two) times daily with a meal.     TRI-PREVIFEM 0.18/0.215/0.25 MG-35 MCG tablet  Generic drug:  Norgestimate-Ethinyl Estradiol Triphasic  Take 1 tablet by mouth daily.     Vitamin D (Ergocalciferol) 50000 units Caps capsule  Commonly known as:  DRISDOL  Take 1 capsule by mouth 2 (two) times a week.         Discharge Condition: Stable   Discharge Instructions Get Medicines reviewed and adjusted: Please take all your medications with you for your next visit with your Primary MD  Please request your Primary MD to go over all hospital tests and procedure/radiological results at the follow up, please ask your Primary MD to get all Hospital records sent to his/her office.  If you experience worsening of your admission symptoms, develop shortness of breath, life threatening emergency, suicidal or homicidal thoughts you must  seek medical attention immediately by calling 911 or calling your MD immediately if symptoms less severe.  You must read complete instructions/literature along with all the possible adverse reactions/side effects for all the Medicines you take and that have been prescribed to you. Take any new Medicines after you have completely understood and accpet all the possible adverse reactions/side effects.   Do not drive when taking Pain medications.   Do not take more than prescribed Pain, Sleep and Anxiety Medications  Special Instructions: If you have smoked or chewed Tobacco in the last 2 yrs please stop smoking, stop any regular Alcohol and or any Recreational drug use.  Wear Seat belts while driving.  Please note  You were cared for by a hospitalist during your hospital stay. Once you are discharged, your primary care physician will handle any further medical issues. Please note that NO REFILLS for any discharge medications will be authorized once you are discharged, as it is imperative that you return to your primary care physician (or establish a relationship with a primary care physician if you do not have one) for your aftercare needs so that they can reassess your need for medications and monitor your lab values.      Discharge Instructions    Diet - low sodium heart healthy    Complete by:  As directed      Increase activity slowly    Complete by:  As directed  No Known Allergies    Disposition:    Consults: None     Significant Diagnostic Studies:  Dg Chest 2 View  05/06/2015  CLINICAL DATA:  Cough for 1 week EXAM: CHEST  2 VIEW COMPARISON:  March 22, 2010 FINDINGS: There is no edema or consolidation. Heart size and pulmonary vascularity are normal. No adenopathy. No bone lesions. IMPRESSION: No edema or consolidation. Electronically Signed   By: Lowella Grip III M.D.   On: 05/06/2015 09:04      Filed Weights   05/05/15 2209 05/06/15 1700   Weight: 94.008 kg (207 lb 4 oz) 94.6 kg (208 lb 8.9 oz)     Microbiology: No results found for this or any previous visit (from the past 240 hour(s)).     Blood Culture No results found for: SDES, [place], CULT, REPTSTATUS    Labs: Results for orders placed or performed during the hospital encounter of 05/06/15 (from the past 48 hour(s))  Glucose, capillary     Status: Abnormal   Collection Time: 05/09/15 12:12 PM  Result Value Ref Range   Glucose-Capillary 283 (H) 65 - 99 mg/dL  Glucose, capillary     Status: Abnormal   Collection Time: 05/09/15  4:51 PM  Result Value Ref Range   Glucose-Capillary 308 (H) 65 - 99 mg/dL  CK     Status: Abnormal   Collection Time: 05/09/15  8:21 PM  Result Value Ref Range   Total CK >50000 (H) 38 - 234 U/L    Comment: RESULTS CONFIRMED BY MANUAL DILUTION  Glucose, capillary     Status: Abnormal   Collection Time: 05/09/15 11:31 PM  Result Value Ref Range   Glucose-Capillary 334 (H) 65 - 99 mg/dL   Comment 1 Notify RN    Comment 2 Document in Chart   Glucose, capillary     Status: Abnormal   Collection Time: 05/10/15  7:37 AM  Result Value Ref Range   Glucose-Capillary 200 (H) 65 - 99 mg/dL  CK     Status: Abnormal   Collection Time: 05/10/15  8:18 AM  Result Value Ref [place]   [place] [address] (H) 38 - 234 U/L    Comment: RESULTS CONFIRMED BY MANUAL DILUTION  Glucose, capillary     Status: Abnormal   Collection Time: 05/10/15 11:59 AM  Result Value Ref Range   Glucose-Capillary 280 (H) 65 - 99 mg/dL  Glucose, capillary     Status: Abnormal   Collection Time: 05/10/15  4:23 PM  Result Value Ref Range   Glucose-Capillary 321 (H) 65 - 99 mg/dL  CK     Status: Abnormal   Collection Time: 05/10/15  8:16 PM  Result Value Ref [place]   [place] [address] (H) 38 - 234 U/L    Comment: RESULTS CONFIRMED BY MANUAL DILUTION  Glucose, capillary     Status: Abnormal   Collection Time: 05/10/15  9:29 PM  Result Value Ref Range    Glucose-Capillary 323 (H) 65 - 99 mg/dL  Glucose, capillary     Status: Abnormal   Collection Time: 05/11/15 12:03 AM  Result Value Ref Range   Glucose-Capillary 254 (H) 65 - 99 mg/dL  CBC     Status: Abnormal   Collection Time: 05/11/15  7:23 AM  Result Value Ref Range   WBC 11.8 (H) 4.0 - 10.5 K/uL   RBC 4.33 3.87 - 5.11 MIL/uL   Hemoglobin 12.0 12.0 - 15.0 g/dL   HCT 35.3 (L) 36.0 - 46.0 %  MCV 81.5 78.0 - 100.0 fL   MCH 27.7 26.0 - 34.0 pg   MCHC 34.0 30.0 - 36.0 g/dL   RDW 12.3 11.5 - 15.5 %   Platelets 302 150 - 400 K/uL  Comprehensive metabolic panel     Status: Abnormal   Collection Time: 05/11/15  7:23 AM  Result Value Ref Range   Sodium 139 135 - 145 mmol/L   Potassium 4.3 3.5 - 5.1 mmol/L   Chloride 105 101 - 111 mmol/L   CO2 26 22 - 32 mmol/L   Glucose, Bld 182 (H) 65 - 99 mg/dL   BUN 19 6 - 20 mg/dL   Creatinine, Ser 0.79 0.44 - 1.00 mg/dL   Calcium 8.6 (L) 8.9 - 10.3 mg/dL   Total Protein 5.5 (L) 6.5 - 8.1 g/dL   Albumin 2.7 (L) 3.5 - 5.0 g/dL   AST 203 (H) 15 - 41 U/L   ALT 190 (H) 14 - 54 U/L   Alkaline Phosphatase 43 38 - 126 U/L   Total Bilirubin 0.3 0.3 - 1.2 mg/dL   GFR calc non Af Amer >60 >60 mL/min   GFR calc Af Amer >60 >60 mL/min    Comment: (NOTE) The eGFR has been calculated using the CKD EPI equation. This calculation has not been validated in all clinical situations. eGFR's persistently <60 mL/min signify possible Chronic Kidney Disease.    Anion gap 8 5 - 15  CK     Status: Abnormal   Collection Time: 05/11/15  7:23 AM  Result Value Ref [place]   [place] [address] (H) 38 - 234 U/L    Comment: RESULTS CONFIRMED BY MANUAL DILUTION  Glucose, capillary     Status: Abnormal   Collection Time: 05/11/15  7:53 AM  Result Value Ref Range   Glucose-Capillary 177 (H) 65 - 99 mg/dL     Lipid Panel  No results found for: CHOL, TRIG, HDL, CHOLHDL, VLDL, LDLCALC, LDLDIRECT   Lab Results  Component Value Date   HGBA1C 7.3* 05/09/2015     Lab  Results  Component Value Date   CREATININE 0.79 05/11/2015     Brief summary 40 y.o. female with pmh of DM2 and episode of idiopathic rhabdomyolysis in 2006 presents to ED with complaints of muscle aches and dark urine. According to patient, symptoms began 2 days ago. She recognized symptoms from previous episode of rhabdo. She describes symptoms of URI last week with nonproductive cough and sore throat. She took OTC Tylenol cold and sinus and symptoms have resolved. She awoke Monday am with muscle aches and dark urine. She has not exercised recently, no recent trauma. She denies recent fever, headache, dizziness, chest pain, sob, abdominal pain, n/v/d.  Upon eval in the ED, pt found to have CK of >50,000 with mild elevation of LFTs. She has received 4L NS fluid bolus and bicarbonate bolus. She denies any new medications. She is to be admitted at this time for further evaluation and treatment. Interestingly, her brother also has hx of rhabdomyolysis of unknown cause.     Assessment and plan   1. Rhabdomyolysis, CK remained greater than 50,000 3 days, remained unresponsive to IV fluids, IV bicarbonate 3 days Recent viral URI with symptoms starting last week Finally patient responded to IV Solu-Medrol, and CK has started to improve Patient declined muscle biopsy for further diagnosis Rule out autoimmune/viral etiologies, CMV IgG positive, IgM negative, HIV antibody negative, TSH normal Patient would need muscle biopsy for definitive diagnosis in the  future and outpatient rheumatology evaluation Will order complete autoimmune panel which is pending at the time of discharge for evaluation of .Lupus,  Sjogren's, Myositis Continue IV steroids, 125 IV every 63 days, now transition to prednisone 40 mg twice a day patient also started on a PPI Outpatient rheumatology appointment made prior to discharge   2. DM2 Initiated ons and sliding scale insulin Patient will discharge home with  Lantus  3. Transaminitis, mild, Improving -secondary to rhabdo -IV hydration, f/u labs Acute hepatitis panel pending at the time of discharge   4. Hx Vit D Def Check vitamin D levels, Pending     Discharge Exam:    Blood pressure 146/74, pulse 65, temperature 98.6 F (37 C), temperature source Oral, resp. rate 14, height '5\' 1"'  (1.549 m), weight 94.6 kg (208 lb 8.9 oz), last menstrual period 04/14/2015, SpO2 96 %.  General: No acute respiratory distress Lungs: Clear to auscultation bilaterally without wheezes or crackles Cardiovascular: Regular rate and rhythm without murmur gallop or rub normal S1 and S2 Abdomen: Nontender, nondistended, soft, bowel sounds positive, no rebound, no ascites, no appreciable mass Extremities: No significant cyanosis, clubbing, or edema bilateral lower extremities    Follow-up Information    Follow up with FULP, CAMMIE, MD. Schedule an appointment as soon as possible for a visit on 05/13/2015.   Specialty:  Family Medicine   Why:  Appointment with Dr. Chapman Fitch PA is on 05/13/15 at 11:15am   Contact information:   [address] [place] [address] [phone]       Schedule an appointment as soon as possible for a visit with Campbell Lerner, MD.   Specialty:  Rheumatology   Why:  [facility] Rheumatology// Left message for office to call patient at home with an appointment   Contact information:   [address] [place] [address] [phone]       Signed: Reyne Dumas 05/11/2015, 12:06 PM        Time spent >45 mins

## 2015-05-11 NOTE — Progress Notes (Signed)
Nsg Discharge Note  Admit Date:  05/06/2015 Discharge date: 05/11/2015   Georgeann OppenheimShetina Lastinger to be D/C'd Home per MD order.  AVS completed.  Copy for chart, and copy for patient signed, and dated. Patient/caregiver able to verbalize understanding.  Discharge Medication:   Medication List    TAKE these medications        Insulin Glargine 100 UNIT/ML Solostar Pen  Commonly known as:  LANTUS  Inject 20 Units into the skin daily at 10 pm.     JANUMET XR [phone] MG Tb24  Generic drug:  SitaGLIPtin-MetFORMIN HCl  Take 1 tablet by mouth daily.     pantoprazole 40 MG tablet  Commonly known as:  PROTONIX  Take 1 tablet (40 mg total) by mouth daily.     predniSONE 20 MG tablet  Commonly known as:  DELTASONE  Take 2 tablets (40 mg total) by mouth 2 (two) times daily with a meal.     TRI-PREVIFEM 0.18/0.215/0.25 MG-35 MCG tablet  Generic drug:  Norgestimate-Ethinyl Estradiol Triphasic  Take 1 tablet by mouth daily.     Vitamin D (Ergocalciferol) 50000 units Caps capsule  Commonly known as:  DRISDOL  Take 1 capsule by mouth 2 (two) times a week.        Discharge Assessment: Filed Vitals:   05/10/15 2127 05/11/15 0513  BP: 136/69 146/74  Pulse: 72 65  Temp: 98.6 F (37 C) 98.6 F (37 C)  Resp: 18 14   Skin clean, dry and intact without evidence of skin break down, no evidence of skin tears noted. IV catheter discontinued intact. Site without signs and symptoms of complications - no redness or edema noted at insertion site, patient denies c/o pain - only slight tenderness at site.  Dressing with slight pressure applied.  D/c Instructions-Education: Discharge instructions given to patient/family with verbalized understanding. D/c education completed with patient/family including follow up instructions, medication list, d/c activities limitations if indicated, with other d/c instructions as indicated by MD - patient able to verbalize understanding, all questions fully  answered. Patient instructed to return to ED, call 911, or call MD for any changes in condition.  Patient escorted via WC, and D/C home via private auto.  Annetta Deiss Consuella Loselaine, RN 05/11/2015 12:00 PM

## 2015-05-11 NOTE — Progress Notes (Signed)
Inpatient Diabetes Program Recommendations  AACE/ADA: New Consensus Statement on Inpatient Glycemic Control (2015)  Target Ranges:  Prepandial:   less than 140 mg/dL      Peak postprandial:   less than 180 mg/dL (1-2 hours)      Critically ill patients:  140 - 180 mg/dL   Consult for Diabetes teaching  Patient to be newly discharged on insulin pen, Lantus 20 units. Unsure if Lantus is covered by insurance, Hospital doctorBasaglar maybe the preferred brand now [facility]. Patient has been receiving IV solumedrol 125 4x/day. Unsure of patient trends on PO prednisone. Expect patient to trend down on her own. Concerned about patient having hypoglycemia on basal insulin. May want to consider increasing frequency of her oral medication Janumet [phone] mg BID instead of daily. Patient to follow up closely with PCP. Paged Dr. Susie CassetteAbrol with concerns.  Thanks,  Christena DeemShannon Loeta Herst RN, MSN, [facility] Inpatient Diabetes Coordinator Team Pager [phone] (8a-5p)

## 2015-05-11 NOTE — Progress Notes (Signed)
Pharmacist Provided - Patient Medication Education Prior to Discharge   [name] is an 40 y.o. female who [facility] with a chief complaint of  Chief Complaint  Patient presents with  . Muscle Pain     [x]  Patient will be discharged with 3 new medications   The following medications were discussed with the patient: Lantus, Protonix, Prednisone  Pain Control medications: []  Yes    [x]  No  Diabetes Medications: [x]  Yes    []  No  Heart Failure Medications: []  Yes    [x]  No  Anticoagulation Medications:  []  Yes    [x]  No  Antibiotics at discharge: []  Yes    [x]  No  Allergy Assessment Completed and Updated: [x]  Yes    []  No Identified Patient Allergies: No Known Allergies   Medication Adherence Assessment: [x]  Excellent (no doses missed/week)      []  Good (1 dose missed/week)      []  Partial (2-3 doses missed/week)      []  Poor (>3 doses missed/week)  Barriers to Obtaining Medications: []  Yes [x]  No  Assessment: We reviewed her new medications and important side effects. We discussed how prednisone may cause some insomnia, and how she can help this by moving her evening dose to mid-day around 2-3 pm. I also discussed how Protonix works best if she takes it 30 min before breakfast. We reviewed how to use an insulin pen with a demo pen. I told her how to put the needles on to the pen, how to safely remove them without getting poked, how to dispose of the used needles, and how to prime the pen before using. We also discussed keeping new pens in the refrigerator and actively being used pens at room temperature for 28 days. She took notes on her discharge summary so I think her compliance will be very good. I also recommended she go speak with her local pharmacy if she needs a refresher, as she is only using the Lantus PRN while she is on the steroids and she may not need it for a week or two. She was very appreciative of the education. I answered all her  questions.   Time spent preparing for discharge counseling: 10 min Time spent counseling patient: 30 min  [name], PharmD Clinical Pharmacy Resident Pager: [phone] 05/11/2015, 1:17 PM

## 2015-05-11 NOTE — Care Management Note (Signed)
Case Management Note  Patient Details  Name: Tracy OppenheimShetina Hoover MRN: [id number] Date of Birth: [birth year]                Subjective/Objective: Spoke with patient at the bedside. She states that she had an episode of this previously and saw a rheumatologist once but could not remember the name. She states that her PCP is [name] w/ [facility]. CM called office, they report that patient saw [name]. Names placed in AVS for apt to be closer to DC. Patient's CK is unresponsive to IVF, high dose steroids started.     Action/Plan:  DC to home. Self Care. Unit clerk agreeable to schedule follow up appointments.   Expected Discharge Date:                  Expected Discharge Plan:  Home/Self Care  In-House Referral:     Discharge planning Services  CM Consult  Post Acute Care Choice:  NA Choice offered to:     DME Arranged:    DME Agency:     HH Arranged:    HH Agency:     Status of Service:  Completed, signed off  Medicare Important Message Given:    Date Medicare IM Given:    Medicare IM give by:    Date Additional Medicare IM Given:    Additional Medicare Important Message give by:     If discussed at Long Length of Stay Meetings, dates discussed:    Additional Comments:  Lawerance SabalDebbie Jacobb Alen, RN 05/11/2015, 11:14 AM

## 2015-05-12 LAB — VITAMIN D 25 HYDROXY (VIT D DEFICIENCY, FRACTURES): Vit D, 25-Hydroxy: 23.3 ng/mL — ABNORMAL LOW (ref 30.0–100.0)

## 2016-01-28 ENCOUNTER — Other Ambulatory Visit: Payer: Self-pay | Admitting: Family

## 2016-01-28 DIAGNOSIS — Z1231 Encounter for screening mammogram for malignant neoplasm of breast: Secondary | ICD-10-CM

## 2016-02-24 ENCOUNTER — Ambulatory Visit
Admission: RE | Admit: 2016-02-24 | Discharge: 2016-02-24 | Disposition: A | Discharge status: Home / Self Care | Payer: 59 | Source: Ambulatory Visit | Attending: Family | Admitting: Family

## 2016-02-24 DIAGNOSIS — Z1231 Encounter for screening mammogram for malignant neoplasm of breast: Secondary | ICD-10-CM

## 2016-05-16 DIAGNOSIS — E119 Type 2 diabetes mellitus without complications: Secondary | ICD-10-CM | POA: Diagnosis not present

## 2016-06-22 DIAGNOSIS — M791 Myalgia: Secondary | ICD-10-CM | POA: Diagnosis not present

## 2016-06-22 DIAGNOSIS — G737 Myopathy in diseases classified elsewhere: Secondary | ICD-10-CM | POA: Diagnosis not present

## 2016-06-22 DIAGNOSIS — R748 Abnormal levels of other serum enzymes: Secondary | ICD-10-CM | POA: Diagnosis not present

## 2016-06-22 DIAGNOSIS — Z8739 Personal history of other diseases of the musculoskeletal system and connective tissue: Secondary | ICD-10-CM | POA: Diagnosis not present

## 2016-12-23 DIAGNOSIS — E782 Mixed hyperlipidemia: Secondary | ICD-10-CM | POA: Diagnosis not present

## 2016-12-23 DIAGNOSIS — E559 Vitamin D deficiency, unspecified: Secondary | ICD-10-CM | POA: Diagnosis not present

## 2016-12-23 DIAGNOSIS — D509 Iron deficiency anemia, unspecified: Secondary | ICD-10-CM | POA: Diagnosis not present

## 2016-12-23 DIAGNOSIS — N926 Irregular menstruation, unspecified: Secondary | ICD-10-CM | POA: Diagnosis not present

## 2016-12-23 DIAGNOSIS — E1165 Type 2 diabetes mellitus with hyperglycemia: Secondary | ICD-10-CM | POA: Diagnosis not present

## 2016-12-23 DIAGNOSIS — Z8739 Personal history of other diseases of the musculoskeletal system and connective tissue: Secondary | ICD-10-CM | POA: Diagnosis not present

## 2017-02-06 ENCOUNTER — Other Ambulatory Visit: Payer: Self-pay | Admitting: Family Medicine

## 2017-02-06 ENCOUNTER — Other Ambulatory Visit (HOSPITAL_COMMUNITY)
Admission: RE | Admit: 2017-02-06 | Discharge: 2017-02-06 | Disposition: A | Discharge status: Home / Self Care | Payer: 59 | Source: Ambulatory Visit | Attending: Family Medicine | Admitting: Family Medicine

## 2017-02-06 DIAGNOSIS — E119 Type 2 diabetes mellitus without complications: Secondary | ICD-10-CM | POA: Diagnosis not present

## 2017-02-06 DIAGNOSIS — Z0000 Encounter for general adult medical examination without abnormal findings: Secondary | ICD-10-CM | POA: Diagnosis not present

## 2017-02-06 DIAGNOSIS — Z124 Encounter for screening for malignant neoplasm of cervix: Secondary | ICD-10-CM | POA: Diagnosis not present

## 2017-02-06 DIAGNOSIS — D509 Iron deficiency anemia, unspecified: Secondary | ICD-10-CM | POA: Diagnosis not present

## 2017-02-06 DIAGNOSIS — E559 Vitamin D deficiency, unspecified: Secondary | ICD-10-CM | POA: Diagnosis not present

## 2017-02-07 LAB — CYTOLOGY - PAP
Diagnosis: NEGATIVE
HPV: NOT DETECTED

## 2017-05-08 DIAGNOSIS — E1165 Type 2 diabetes mellitus with hyperglycemia: Secondary | ICD-10-CM | POA: Diagnosis not present

## 2017-05-08 DIAGNOSIS — D509 Iron deficiency anemia, unspecified: Secondary | ICD-10-CM | POA: Diagnosis not present

## 2017-05-08 DIAGNOSIS — Z713 Dietary counseling and surveillance: Secondary | ICD-10-CM | POA: Diagnosis not present

## 2017-08-29 DIAGNOSIS — D509 Iron deficiency anemia, unspecified: Secondary | ICD-10-CM | POA: Diagnosis not present

## 2017-08-29 DIAGNOSIS — E119 Type 2 diabetes mellitus without complications: Secondary | ICD-10-CM | POA: Diagnosis not present

## 2017-08-29 DIAGNOSIS — R030 Elevated blood-pressure reading, without diagnosis of hypertension: Secondary | ICD-10-CM | POA: Diagnosis not present

## 2017-12-26 ENCOUNTER — Other Ambulatory Visit: Payer: Self-pay | Admitting: Family Medicine

## 2017-12-26 DIAGNOSIS — Z1231 Encounter for screening mammogram for malignant neoplasm of breast: Secondary | ICD-10-CM

## 2018-02-06 ENCOUNTER — Ambulatory Visit
Admission: RE | Admit: 2018-02-06 | Discharge: 2018-02-06 | Disposition: A | Discharge status: Home / Self Care | Payer: 59 | Source: Ambulatory Visit | Attending: Family Medicine | Admitting: Family Medicine

## 2018-02-06 DIAGNOSIS — Z1231 Encounter for screening mammogram for malignant neoplasm of breast: Secondary | ICD-10-CM | POA: Diagnosis not present

## 2018-02-23 DIAGNOSIS — Z0000 Encounter for general adult medical examination without abnormal findings: Secondary | ICD-10-CM | POA: Diagnosis not present

## 2018-02-23 DIAGNOSIS — E782 Mixed hyperlipidemia: Secondary | ICD-10-CM | POA: Diagnosis not present

## 2018-02-23 DIAGNOSIS — D509 Iron deficiency anemia, unspecified: Secondary | ICD-10-CM | POA: Diagnosis not present

## 2018-02-23 DIAGNOSIS — E1169 Type 2 diabetes mellitus with other specified complication: Secondary | ICD-10-CM | POA: Diagnosis not present

## 2020-04-09 ENCOUNTER — Other Ambulatory Visit: Payer: Self-pay | Admitting: Family Medicine

## 2020-04-09 DIAGNOSIS — Z1231 Encounter for screening mammogram for malignant neoplasm of breast: Secondary | ICD-10-CM

## 2020-06-01 ENCOUNTER — Ambulatory Visit
Admission: RE | Admit: 2020-06-01 | Discharge: 2020-06-01 | Disposition: A | Discharge status: Home / Self Care | Payer: 59 | Source: Ambulatory Visit | Attending: Family Medicine | Admitting: Family Medicine

## 2020-06-01 ENCOUNTER — Ambulatory Visit: Payer: 59

## 2020-06-01 ENCOUNTER — Other Ambulatory Visit: Payer: Self-pay

## 2020-06-01 DIAGNOSIS — Z1231 Encounter for screening mammogram for malignant neoplasm of breast: Secondary | ICD-10-CM

## 2021-09-07 IMAGING — MG DIGITAL SCREENING BILAT W/ CAD
4 series · 4 of 4 positions shown · non-contrast
Comparison: Previous exam(s).

CLINICAL DATA: Screening.

EXAM:
DIGITAL SCREENING BILATERAL MAMMOGRAM WITH CAD
TECHNIQUE: Bilateral screening digital craniocaudal and mediolateral oblique
mammograms were obtained. The images were evaluated with
computer-aided detection.

[L CC]
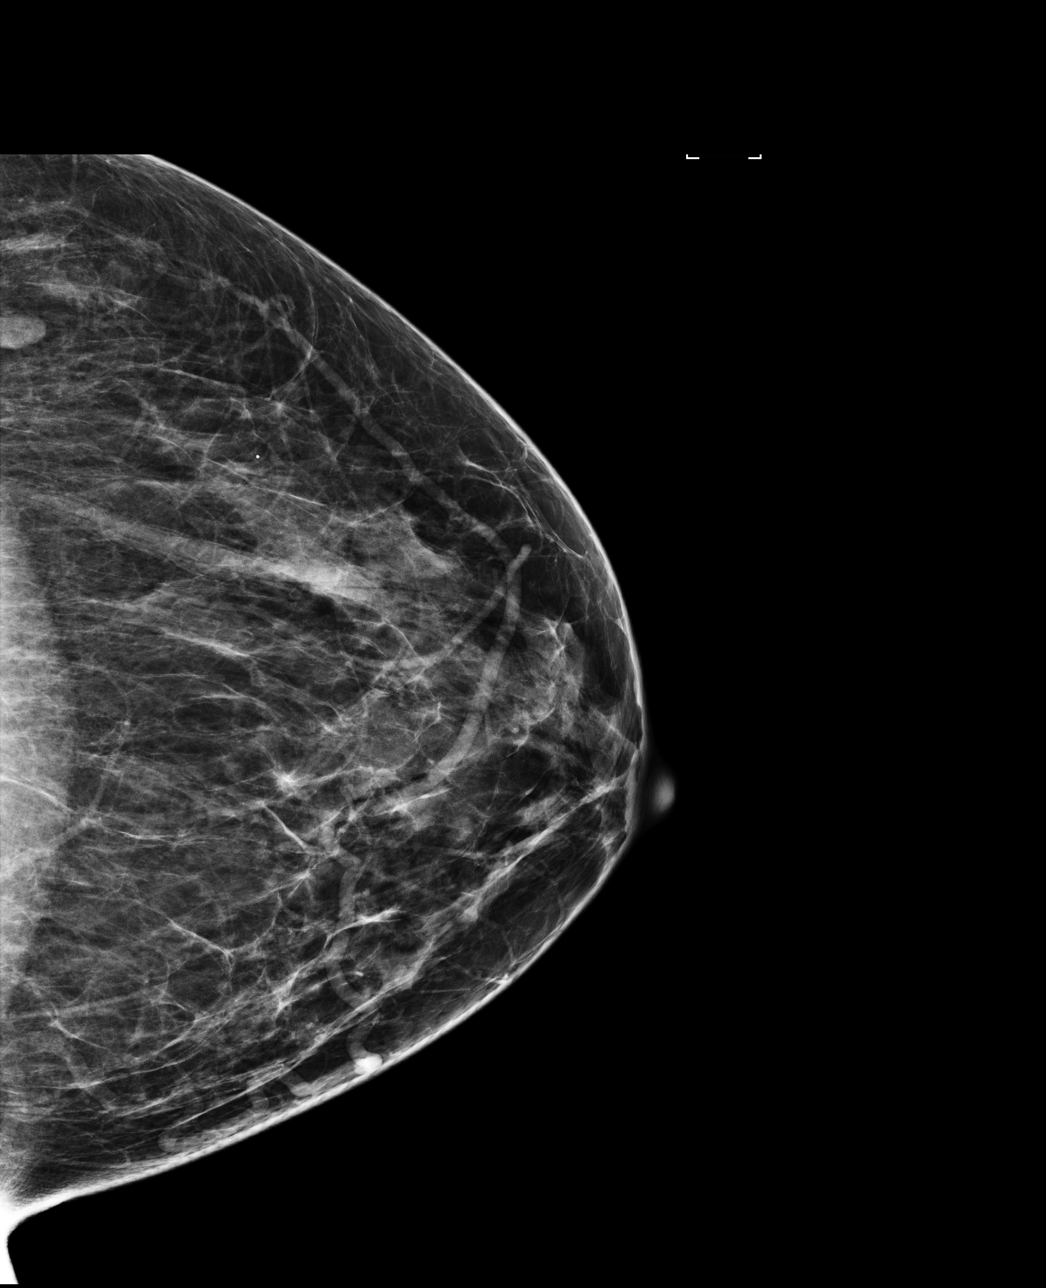

[R MLO]
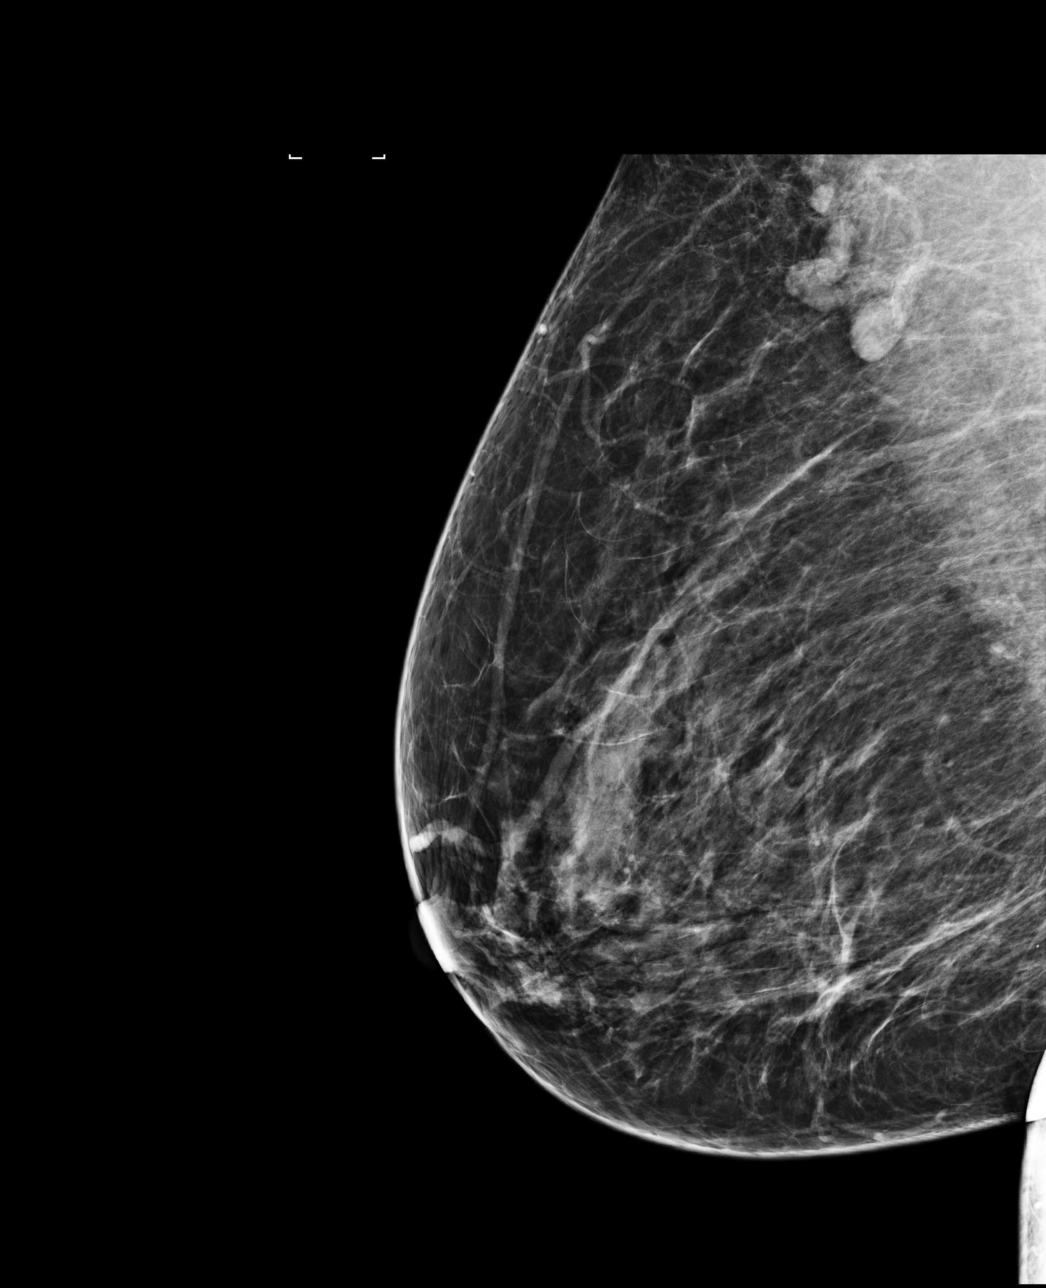

[L MLO]
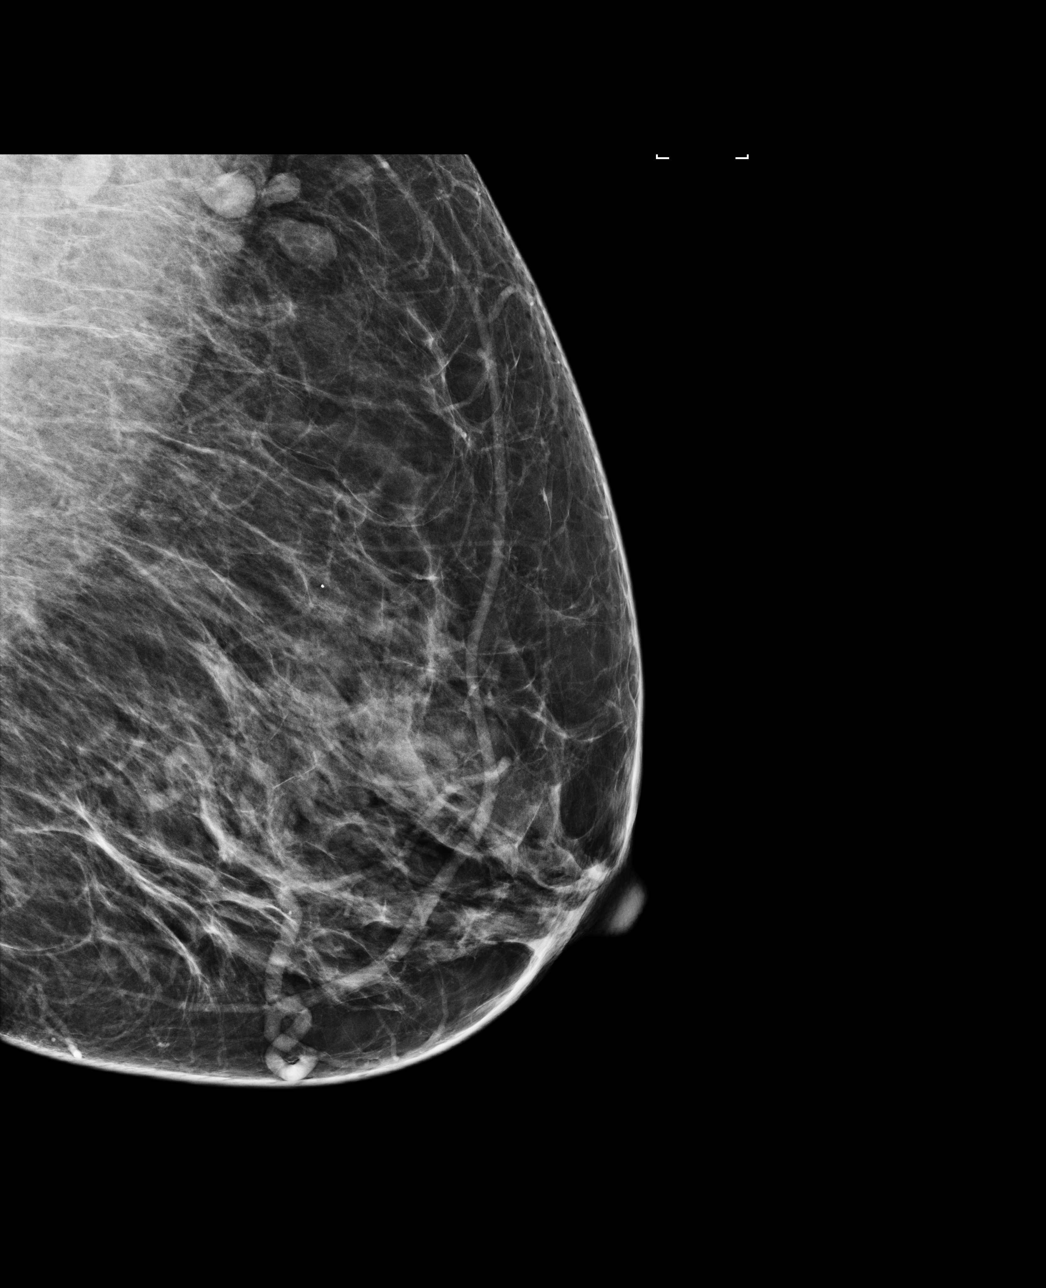

[R CC]
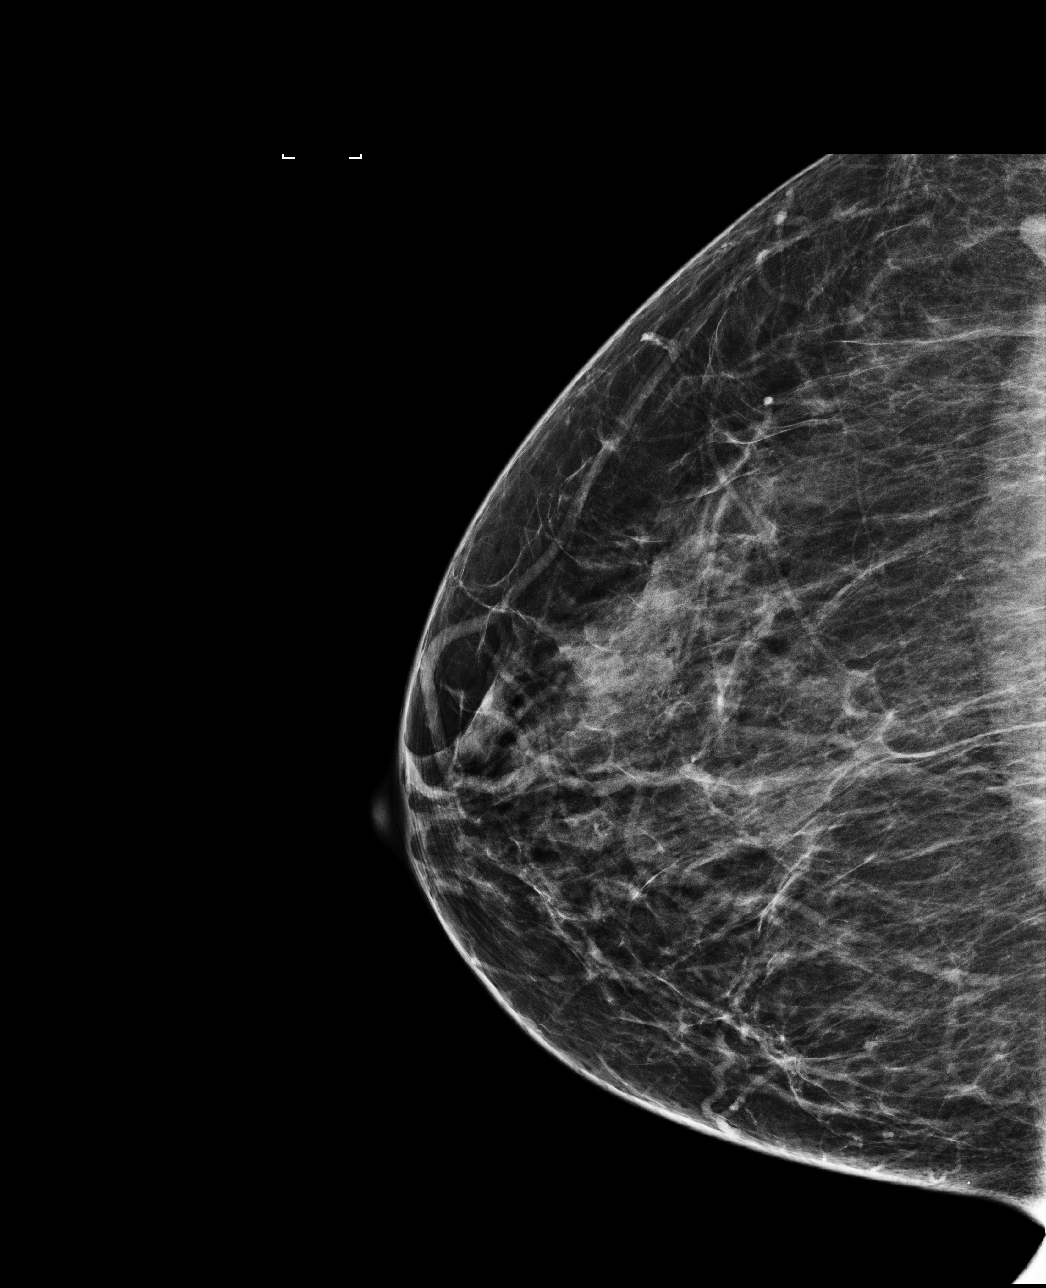

[4 of 4 positions shown; findings below may reference images not displayed]

ACR Breast Density Category c: The breast tissue is heterogeneously
dense, which may obscure small masses.
FINDINGS: There are no findings suspicious for malignancy. The images were
evaluated with computer-aided detection.
IMPRESSION: No mammographic evidence of malignancy. A result letter of this
screening mammogram will be mailed directly to the patient.

RECOMMENDATION:
Screening mammogram in one year. (Code:WI-W-Z4A)

BI-RADS CATEGORY  1: Negative.

## 2021-11-18 ENCOUNTER — Other Ambulatory Visit: Payer: Self-pay | Admitting: Nurse Practitioner

## 2021-11-18 ENCOUNTER — Other Ambulatory Visit (HOSPITAL_COMMUNITY)
Admission: RE | Admit: 2021-11-18 | Discharge: 2021-11-18 | Disposition: A | Discharge status: Home / Self Care | Payer: 59 | Source: Ambulatory Visit | Attending: Nurse Practitioner | Admitting: Nurse Practitioner

## 2021-11-18 DIAGNOSIS — Z124 Encounter for screening for malignant neoplasm of cervix: Secondary | ICD-10-CM | POA: Insufficient documentation

## 2021-11-24 LAB — CYTOLOGY - PAP
Comment: NEGATIVE
Diagnosis: UNDETERMINED — AB
High risk HPV: NEGATIVE

## 2021-11-29 MED ORDER — ONDANSETRON HCL 4 MG/2ML IJ SOLN
INTRAMUSCULAR | Status: AC
  Filled 2021-11-29: qty 2

## 2022-04-07 ENCOUNTER — Other Ambulatory Visit: Payer: Self-pay | Admitting: Family Medicine

## 2022-04-07 DIAGNOSIS — Z1231 Encounter for screening mammogram for malignant neoplasm of breast: Secondary | ICD-10-CM

## 2022-04-20 ENCOUNTER — Ambulatory Visit
Admission: RE | Admit: 2022-04-20 | Discharge: 2022-04-20 | Disposition: A | Discharge status: Home / Self Care | Payer: 59 | Source: Ambulatory Visit | Attending: Family Medicine | Admitting: Family Medicine

## 2022-04-20 ENCOUNTER — Other Ambulatory Visit: Payer: Self-pay | Admitting: Family Medicine

## 2022-04-20 DIAGNOSIS — Z1231 Encounter for screening mammogram for malignant neoplasm of breast: Secondary | ICD-10-CM

## 2023-12-04 ENCOUNTER — Other Ambulatory Visit: Payer: Self-pay | Admitting: Family Medicine

## 2023-12-04 DIAGNOSIS — Z1231 Encounter for screening mammogram for malignant neoplasm of breast: Secondary | ICD-10-CM

## 2023-12-13 ENCOUNTER — Ambulatory Visit
Admission: RE | Admit: 2023-12-13 | Discharge: 2023-12-13 | Disposition: A | Discharge status: Home / Self Care | Source: Ambulatory Visit | Attending: Family Medicine | Admitting: Family Medicine

## 2023-12-13 DIAGNOSIS — Z1231 Encounter for screening mammogram for malignant neoplasm of breast: Secondary | ICD-10-CM
